# Patient Record
Sex: Female | Born: 1995 | Race: Black or African American | Hispanic: No | Marital: Married | Smoking: Former smoker
Health system: Southern US, Community
[De-identification: ages and names within clinical notes are randomized; demographics above are authoritative.]

## PROBLEM LIST (undated history)

## (undated) ENCOUNTER — Inpatient Hospital Stay (HOSPITAL_COMMUNITY): Payer: Self-pay

## (undated) ENCOUNTER — Inpatient Hospital Stay: Payer: Self-pay

## (undated) DIAGNOSIS — O321XX0 Maternal care for breech presentation, not applicable or unspecified: Secondary | ICD-10-CM

## (undated) DIAGNOSIS — IMO0002 Reserved for concepts with insufficient information to code with codable children: Secondary | ICD-10-CM

## (undated) DIAGNOSIS — O039 Complete or unspecified spontaneous abortion without complication: Secondary | ICD-10-CM

## (undated) DIAGNOSIS — G43909 Migraine, unspecified, not intractable, without status migrainosus: Secondary | ICD-10-CM

## (undated) DIAGNOSIS — D219 Benign neoplasm of connective and other soft tissue, unspecified: Secondary | ICD-10-CM

## (undated) DIAGNOSIS — D649 Anemia, unspecified: Secondary | ICD-10-CM

## (undated) HISTORY — DX: Benign neoplasm of connective and other soft tissue, unspecified: D21.9

---

## 2013-12-29 HISTORY — PX: NOSE SURGERY: SHX723

## 2014-08-13 ENCOUNTER — Encounter (HOSPITAL_COMMUNITY): Payer: Self-pay | Admitting: Emergency Medicine

## 2014-08-13 DIAGNOSIS — Z3202 Encounter for pregnancy test, result negative: Secondary | ICD-10-CM | POA: Insufficient documentation

## 2014-08-13 DIAGNOSIS — Z862 Personal history of diseases of the blood and blood-forming organs and certain disorders involving the immune mechanism: Secondary | ICD-10-CM | POA: Insufficient documentation

## 2014-08-13 DIAGNOSIS — S199XXA Unspecified injury of neck, initial encounter: Secondary | ICD-10-CM

## 2014-08-13 DIAGNOSIS — F17200 Nicotine dependence, unspecified, uncomplicated: Secondary | ICD-10-CM | POA: Insufficient documentation

## 2014-08-13 DIAGNOSIS — S0180XA Unspecified open wound of other part of head, initial encounter: Secondary | ICD-10-CM | POA: Insufficient documentation

## 2014-08-13 DIAGNOSIS — S1093XA Contusion of unspecified part of neck, initial encounter: Secondary | ICD-10-CM

## 2014-08-13 DIAGNOSIS — S0003XA Contusion of scalp, initial encounter: Secondary | ICD-10-CM | POA: Insufficient documentation

## 2014-08-13 DIAGNOSIS — S0083XA Contusion of other part of head, initial encounter: Secondary | ICD-10-CM | POA: Insufficient documentation

## 2014-08-13 DIAGNOSIS — S0993XA Unspecified injury of face, initial encounter: Secondary | ICD-10-CM | POA: Insufficient documentation

## 2014-08-13 LAB — CBC WITH DIFFERENTIAL/PLATELET
BASOS ABS: 0 10*3/uL (ref 0.0–0.1)
Basophils Relative: 0 % (ref 0–1)
Eosinophils Absolute: 0 10*3/uL (ref 0.0–0.7)
Eosinophils Relative: 0 % (ref 0–5)
HCT: 29.7 % — ABNORMAL LOW (ref 36.0–46.0)
Hemoglobin: 9.4 g/dL — ABNORMAL LOW (ref 12.0–15.0)
LYMPHS ABS: 1.9 10*3/uL (ref 0.7–4.0)
Lymphocytes Relative: 21 % (ref 12–46)
MCH: 20.8 pg — AB (ref 26.0–34.0)
MCHC: 31.6 g/dL (ref 30.0–36.0)
MCV: 65.9 fL — AB (ref 78.0–100.0)
MONO ABS: 0.7 10*3/uL (ref 0.1–1.0)
Monocytes Relative: 8 % (ref 3–12)
Neutro Abs: 6.4 10*3/uL (ref 1.7–7.7)
Neutrophils Relative %: 71 % (ref 43–77)
Platelets: 332 10*3/uL (ref 150–400)
RBC: 4.51 MIL/uL (ref 3.87–5.11)
RDW: 19.4 % — AB (ref 11.5–15.5)
WBC: 9 10*3/uL (ref 4.0–10.5)

## 2014-08-13 LAB — COMPREHENSIVE METABOLIC PANEL
ALBUMIN: 3.9 g/dL (ref 3.5–5.2)
ALT: 10 U/L (ref 0–35)
ANION GAP: 13 (ref 5–15)
AST: 17 U/L (ref 0–37)
Alkaline Phosphatase: 43 U/L (ref 39–117)
BUN: 8 mg/dL (ref 6–23)
CHLORIDE: 102 meq/L (ref 96–112)
CO2: 23 mEq/L (ref 19–32)
Calcium: 9.6 mg/dL (ref 8.4–10.5)
Creatinine, Ser: 0.6 mg/dL (ref 0.50–1.10)
GFR calc Af Amer: >90 mL/min (ref >90)
GFR calc non Af Amer: >90 mL/min (ref >90)
Glucose, Bld: 90 mg/dL (ref 70–99)
Potassium: 3.3 mEq/L — ABNORMAL LOW (ref 3.7–5.3)
Sodium: 138 mEq/L (ref 137–147)
TOTAL PROTEIN: 7.7 g/dL (ref 6.0–8.3)
Total Bilirubin: 0.3 mg/dL (ref 0.3–1.2)
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
## 2014-08-14 ENCOUNTER — Emergency Department (HOSPITAL_COMMUNITY)
Admission: EM | Admit: 2014-08-14 | Discharge: 2014-08-14 | Disposition: A | Discharge status: Home / Self Care | Payer: Self-pay | Attending: Emergency Medicine | Admitting: Emergency Medicine

## 2014-08-14 DIAGNOSIS — S0181XA Laceration without foreign body of other part of head, initial encounter: Secondary | ICD-10-CM

## 2014-08-14 DIAGNOSIS — S0033XA Contusion of nose, initial encounter: Secondary | ICD-10-CM

## 2014-08-14 HISTORY — DX: Anemia, unspecified: D64.9

## 2014-08-14 LAB — URINALYSIS, ROUTINE W REFLEX MICROSCOPIC
BILIRUBIN URINE: NEGATIVE
Glucose, UA: NEGATIVE mg/dL
Hgb urine dipstick: NEGATIVE
Ketones, ur: 15 mg/dL — AB
NITRITE: NEGATIVE
PROTEIN: NEGATIVE mg/dL
SPECIFIC GRAVITY, URINE: 1.025 (ref 1.005–1.030)
UROBILINOGEN UA: 1 mg/dL (ref 0.0–1.0)
pH: 6.5 (ref 5.0–8.0)

## 2014-08-14 LAB — URINE MICROSCOPIC-ADD ON

## 2014-08-14 LAB — POC URINE PREG, ED: PREG TEST UR: NEGATIVE

## 2014-08-14 MED ORDER — TRAMADOL HCL 50 MG PO TABS
50.0000 mg | ORAL_TABLET | Freq: Once | ORAL | Status: AC
Start: 2014-08-14 — End: 2014-08-14
  Administered 2014-08-14: 50 mg via ORAL
  Filled 2014-08-14: qty 1

## 2014-08-14 MED ORDER — KETOROLAC TROMETHAMINE 30 MG/ML IJ SOLN
30.0000 mg | Freq: Once | INTRAMUSCULAR | Status: DC
  Filled 2014-08-14: qty 1

## 2014-08-14 MED ORDER — KETOROLAC TROMETHAMINE 30 MG/ML IJ SOLN
30.0000 mg | Freq: Once | INTRAMUSCULAR | Status: AC
  Administered 2014-08-14: 30 mg via INTRAMUSCULAR

## 2014-08-14 MED ORDER — OXYCODONE-ACETAMINOPHEN 5-325 MG PO TABS
2.0000 | ORAL_TABLET | Freq: Once | ORAL | Status: AC
  Administered 2014-08-14: 2 via ORAL
  Filled 2014-08-14: qty 2

## 2014-08-14 MED ORDER — BACITRACIN ZINC 500 UNIT/GM EX OINT: 1.0000 "application " | TOPICAL_OINTMENT | Freq: Two times a day (BID) | CUTANEOUS | Status: DC

## 2014-08-14 NOTE — ED Notes (Signed)
NAD at this time.  
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
## 2014-08-14 NOTE — Discharge Instructions (Signed)
Contusion °A contusion is a deep bruise. Contusions are the result of an injury that caused bleeding under the skin. The contusion may turn blue, purple, or yellow. Minor injuries will give you a painless contusion, but more severe contusions may stay painful and swollen for a few weeks.  °CAUSES  °A contusion is usually caused by a blow, trauma, or direct force to an area of the body. °SYMPTOMS  °· Swelling and redness of the injured area. °· Bruising of the injured area. °· Tenderness and soreness of the injured area. °· Pain. °DIAGNOSIS  °The diagnosis can be made by taking a history and physical exam. An X-ray, CT scan, or MRI may be needed to determine if there were any associated injuries, such as fractures. °TREATMENT  °Specific treatment will depend on what area of the body was injured. In general, the best treatment for a contusion is resting, icing, elevating, and applying cold compresses to the injured area. Over-the-counter medicines may also be recommended for pain control. Ask your caregiver what the best treatment is for your contusion. °HOME CARE INSTRUCTIONS  °· Put ice on the injured area. °¨ Put ice in a plastic bag. °¨ Place a towel between your skin and the bag. °¨ Leave the ice on for 15-20 minutes, 3-4 times a day, or as directed by your health care provider. °· Only take over-the-counter or prescription medicines for pain, discomfort, or fever as directed by your caregiver. Your caregiver may recommend avoiding anti-inflammatory medicines (aspirin, ibuprofen, and naproxen) for 48 hours because these medicines may increase bruising. °· Rest the injured area. °· If possible, elevate the injured area to reduce swelling. °SEEK IMMEDIATE MEDICAL CARE IF:  °· You have increased bruising or swelling. °· You have pain that is getting worse. °· Your swelling or pain is not relieved with medicines. °MAKE SURE YOU:  °· Understand these instructions. °· Will watch your condition. °· Will get help right  away if you are not doing well or get worse. °Document Released: 09/24/2005 Document Revised: 12/20/2013 Document Reviewed: 10/20/2011 °[employer]® Patient Information ©2015 [employer]. This information is not intended to replace advice given to you by your health care provider. Make sure you discuss any questions you have with your health care provider. ° °

## 2014-09-13 ENCOUNTER — Emergency Department (HOSPITAL_COMMUNITY)
Admission: EM | Admit: 2014-09-13 | Discharge: 2014-09-13 | Disposition: A | Discharge status: Home / Self Care | Payer: Medicaid Other | Attending: Emergency Medicine | Admitting: Emergency Medicine

## 2014-09-13 ENCOUNTER — Encounter (HOSPITAL_COMMUNITY): Payer: Self-pay | Admitting: Emergency Medicine

## 2014-09-13 ENCOUNTER — Emergency Department (HOSPITAL_COMMUNITY): Payer: Medicaid Other

## 2014-09-13 DIAGNOSIS — O209 Hemorrhage in early pregnancy, unspecified: Secondary | ICD-10-CM | POA: Diagnosis present

## 2014-09-13 DIAGNOSIS — B9689 Other specified bacterial agents as the cause of diseases classified elsewhere: Secondary | ICD-10-CM | POA: Diagnosis not present

## 2014-09-13 DIAGNOSIS — Z862 Personal history of diseases of the blood and blood-forming organs and certain disorders involving the immune mechanism: Secondary | ICD-10-CM | POA: Diagnosis not present

## 2014-09-13 DIAGNOSIS — O2390 Unspecified genitourinary tract infection in pregnancy, unspecified trimester: Secondary | ICD-10-CM | POA: Insufficient documentation

## 2014-09-13 DIAGNOSIS — O200 Threatened abortion: Secondary | ICD-10-CM | POA: Insufficient documentation

## 2014-09-13 DIAGNOSIS — A499 Bacterial infection, unspecified: Secondary | ICD-10-CM | POA: Insufficient documentation

## 2014-09-13 DIAGNOSIS — O99330 Smoking (tobacco) complicating pregnancy, unspecified trimester: Secondary | ICD-10-CM | POA: Diagnosis not present

## 2014-09-13 DIAGNOSIS — N760 Acute vaginitis: Secondary | ICD-10-CM | POA: Diagnosis not present

## 2014-09-13 DIAGNOSIS — Z792 Long term (current) use of antibiotics: Secondary | ICD-10-CM | POA: Insufficient documentation

## 2014-09-13 DIAGNOSIS — N390 Urinary tract infection, site not specified: Secondary | ICD-10-CM

## 2014-09-13 LAB — URINE MICROSCOPIC-ADD ON

## 2014-09-13 LAB — CBC WITH DIFFERENTIAL/PLATELET
BASOS PCT: 0 % (ref 0–1)
Basophils Absolute: 0 10*3/uL (ref 0.0–0.1)
EOS PCT: 1 % (ref 0–5)
Eosinophils Absolute: 0.1 10*3/uL (ref 0.0–0.7)
HCT: 32.1 % — ABNORMAL LOW (ref 36.0–46.0)
Hemoglobin: 10.4 g/dL — ABNORMAL LOW (ref 12.0–15.0)
LYMPHS PCT: 39 % (ref 12–46)
Lymphs Abs: 2 10*3/uL (ref 0.7–4.0)
MCH: 21.7 pg — ABNORMAL LOW (ref 26.0–34.0)
MCHC: 32.4 g/dL (ref 30.0–36.0)
MCV: 67 fL — ABNORMAL LOW (ref 78.0–100.0)
Monocytes Absolute: 0.8 10*3/uL (ref 0.1–1.0)
Monocytes Relative: 15 % — ABNORMAL HIGH (ref 3–12)
NEUTROS PCT: 45 % (ref 43–77)
Neutro Abs: 2.3 10*3/uL (ref 1.7–7.7)
PLATELETS: 286 10*3/uL (ref 150–400)
RBC: 4.79 MIL/uL (ref 3.87–5.11)
RDW: 20.5 % — ABNORMAL HIGH (ref 11.5–15.5)
WBC: 5.2 10*3/uL (ref 4.0–10.5)

## 2014-09-13 LAB — URINALYSIS, ROUTINE W REFLEX MICROSCOPIC
Bilirubin Urine: NEGATIVE
Glucose, UA: NEGATIVE mg/dL
Hgb urine dipstick: NEGATIVE
Ketones, ur: 15 mg/dL — AB
Nitrite: POSITIVE — AB
Protein, ur: NEGATIVE mg/dL
Specific Gravity, Urine: 1.029 (ref 1.005–1.030)
Urobilinogen, UA: 1 mg/dL (ref 0.0–1.0)
pH: 7.5 (ref 5.0–8.0)

## 2014-09-13 LAB — WET PREP, GENITAL
TRICH WET PREP: NONE SEEN
YEAST WET PREP: NONE SEEN

## 2014-09-13 LAB — PREGNANCY, URINE: Preg Test, Ur: POSITIVE — AB

## 2014-09-13 LAB — HCG, QUANTITATIVE, PREGNANCY: hCG, Beta Chain, Quant, S: 2784 m[IU]/mL — ABNORMAL HIGH (ref <5)

## 2014-09-13 LAB — ABO/RH: ABO/RH(D): A POS

## 2014-09-13 LAB — OB RESULTS CONSOLE GC/CHLAMYDIA
Chlamydia: NEGATIVE
Gonorrhea: NEGATIVE

## 2014-09-13 MED ORDER — PRENATAL VITAMINS 28-0.8 MG PO TABS: 1.0000 | ORAL_TABLET | Freq: Every day | ORAL | Status: DC

## 2014-09-13 MED ORDER — METRONIDAZOLE 500 MG PO TABS: 500.0000 mg | ORAL_TABLET | Freq: Two times a day (BID) | ORAL | Status: DC

## 2014-09-13 MED ORDER — CEPHALEXIN 500 MG PO CAPS: 500.0000 mg | ORAL_CAPSULE | Freq: Two times a day (BID) | ORAL | Status: DC

## 2014-09-13 NOTE — ED Provider Notes (Signed)
CSN: [id number]     Arrival date & time 09/13/14  1210 History   First MD Initiated Contact with Patient 09/13/14 1254     Chief Complaint  Patient presents with  . Vaginal Bleeding     (Consider location/radiation/quality/duration/timing/severity/associated sxs/prior Treatment) Patient is a 18 y.o. female presenting with vaginal bleeding. The history is provided by the patient. No language interpreter was used.  Vaginal Bleeding Severity:  Mild Duration:  1 week Timing:  Intermittent Progression:  Worsening Menstrual history:  Regular Possible pregnancy: yes   Associated symptoms: abdominal pain   Associated symptoms: no back pain, no dysuria, no fever and no vaginal discharge   Associated symptoms comment:  She is here for evaluation of vaginal bleeding after a home pregnancy test was positive this week. Last menstrual cycle was July of this year. She has seen intermittent, light vaginal spotting about every other day for the past week. She reports left adnexal and suprapubic cramping. No vomiting. She denies back pain or dysuria. She is G2P1 with a previous uncomplicated, full term pregnancy.   Past Medical History  Diagnosis Date  . Anemia    Past Surgical History  Procedure Laterality Date  . Cesarean section     History reviewed. No pertinent family history. History  Substance Use Topics  . Smoking status: Current Some Day Smoker    Types: Cigarettes  . Smokeless tobacco: Not on file  . Alcohol Use: No   OB History   Grav Para Term Preterm Abortions TAB SAB Ect Mult Living                 Review of Systems  Constitutional: Negative for fever.  Respiratory: Negative.   Cardiovascular: Negative.   Gastrointestinal: Positive for abdominal pain. Negative for vomiting.  Genitourinary: Positive for vaginal bleeding. Negative for dysuria and vaginal discharge.  Musculoskeletal: Negative.  Negative for back pain.  Neurological: Negative.       Allergies  Asa;  Onion; Venomil honey bee venom; and Mustard  Home Medications   Prior to Admission medications   Medication Sig Start Date End Date Taking? Authorizing Provider  bacitracin ointment Apply 1 application topically 2 (two) times daily. 08/14/14  Yes Elyn Peers, MD   BP 90/48  Pulse 62  Temp(Src) 98.9 F (37.2 C) (Oral)  Resp 15  SpO2 100% Physical Exam  Constitutional: She is oriented to person, place, and time. She appears well-developed and well-nourished. No distress.  Neck: Normal range of motion.  Cardiovascular: Normal rate.   Pulmonary/Chest: Effort normal.  Abdominal: Soft. There is no tenderness.  Genitourinary:  No cervical bleeding present. Small amount grayish discharge. No CMT, no adnexal tenderness. There is mild to moderate left adnexal tenderness without palpable mass.   Musculoskeletal: Normal range of motion.  Neurological: She is alert and oriented to person, place, and time.  Skin: Skin is warm and dry.  Psychiatric: She has a normal mood and affect.    ED Course  Procedures (including critical care time) Labs Review Labs Reviewed  PREGNANCY, URINE - Abnormal; Notable for the following:    Preg Test, Ur POSITIVE (*)    All other components within normal limits  URINALYSIS, ROUTINE W REFLEX MICROSCOPIC - Abnormal; Notable for the following:    APPearance HAZY (*)    Ketones, ur 15 (*)    Nitrite POSITIVE (*)    Leukocytes, UA TRACE (*)    All other components within normal limits  CBC WITH DIFFERENTIAL - Abnormal; Notable  for the following:    Hemoglobin 10.4 (*)    HCT 32.1 (*)    MCV 67.0 (*)    MCH 21.7 (*)    RDW 20.5 (*)    All other components within normal limits  HCG, QUANTITATIVE, PREGNANCY - Abnormal; Notable for the following:    hCG, Beta Chain, Quant, S 2784 (*)    All other components within normal limits  URINE MICROSCOPIC-ADD ON - Abnormal; Notable for the following:    Squamous Epithelial / LPF FEW (*)    Bacteria, UA MANY (*)     All other components within normal limits  WET PREP, GENITAL  GC/CHLAMYDIA PROBE AMP  ABO/RH   Results for orders placed during the hospital encounter of 09/13/14  WET PREP, GENITAL      Result Value Ref Range   Yeast Wet Prep HPF POC NONE SEEN  NONE SEEN   Trich, Wet Prep NONE SEEN  NONE SEEN   Clue Cells Wet Prep HPF POC MODERATE (*) NONE SEEN   WBC, Wet Prep HPF POC FEW (*) NONE SEEN  PREGNANCY, URINE      Result Value Ref Range   Preg Test, Ur POSITIVE (*) NEGATIVE  URINALYSIS, ROUTINE W REFLEX MICROSCOPIC      Result Value Ref Range   Color, Urine YELLOW  YELLOW   APPearance HAZY (*) CLEAR   Specific Gravity, Urine 1.029  1.005 - 1.030   pH 7.5  5.0 - 8.0   Glucose, UA NEGATIVE  NEGATIVE mg/dL   Hgb urine dipstick NEGATIVE  NEGATIVE   Bilirubin Urine NEGATIVE  NEGATIVE   Ketones, ur 15 (*) NEGATIVE mg/dL   Protein, ur NEGATIVE  NEGATIVE mg/dL   Urobilinogen, UA 1.0  0.0 - 1.0 mg/dL   Nitrite POSITIVE (*) NEGATIVE   Leukocytes, UA TRACE (*) NEGATIVE  CBC WITH DIFFERENTIAL      Result Value Ref Range   WBC 5.2  4.0 - 10.5 K/uL   RBC 4.79  3.87 - 5.11 MIL/uL   Hemoglobin 10.4 (*) 12.0 - 15.0 g/dL   HCT 32.1 (*) 36.0 - 46.0 %   MCV 67.0 (*) 78.0 - 100.0 fL   MCH 21.7 (*) 26.0 - 34.0 pg   MCHC 32.4  30.0 - 36.0 g/dL   RDW 20.5 (*) 11.5 - 15.5 %   Platelets 286  150 - 400 K/uL   Neutrophils Relative % 45  43 - 77 %   Lymphocytes Relative 39  12 - 46 %   Monocytes Relative 15 (*) 3 - 12 %   Eosinophils Relative 1  0 - 5 %   Basophils Relative 0  0 - 1 %   Neutro Abs 2.3  1.7 - 7.7 K/uL   Lymphs Abs 2.0  0.7 - 4.0 K/uL   Monocytes Absolute 0.8  0.1 - 1.0 K/uL   Eosinophils Absolute 0.1  0.0 - 0.7 K/uL   Basophils Absolute 0.0  0.0 - 0.1 K/uL   RBC Morphology ELLIPTOCYTES    HCG, QUANTITATIVE, PREGNANCY      Result Value Ref Range   hCG, Beta Chain, Quant, S 2784 (*) <5 mIU/mL  URINE MICROSCOPIC-ADD ON      Result Value Ref Range   Squamous Epithelial / LPF FEW (*)  RARE   WBC, UA 3-6  <3 WBC/hpf   RBC / HPF 0-2  <3 RBC/hpf   Bacteria, UA MANY (*) RARE   Urine-Other MUCOUS PRESENT       Imaging  Review No results found.   EKG Interpretation None      MDM   Final diagnoses:  None    1. Threatened abortion 2. UTI 3. BV  Ultrasound pending. Patient care transferred to [facility], [facility] pending results.    Dewaine Oats, PA-C 09/13/14 1616

## 2014-09-13 NOTE — ED Provider Notes (Signed)
Medical screening examination/treatment/procedure(s) were performed by non-physician practitioner and as supervising physician I was immediately available for consultation/collaboration.   EKG Interpretation None       Virgel Manifold, MD 09/13/14 1711

## 2014-09-13 NOTE — ED Notes (Signed)
Pt has not had an ultrasound to confirm intrauterine pregnancy and patient is c/o pain to left lower abdomen

## 2014-09-13 NOTE — Discharge Instructions (Signed)
Read the information below.  Use the prescribed medication as directed.  Please discuss all new medications with your pharmacist.  You may return to the Emergency Department at any time for worsening condition or any new symptoms that concern you.  If you develop fevers, worsening abdominal pain, uncontrolled bleeding, uncontrolled vomiting, or are unable to tolerate fluids by mouth, go directly to [facility] for a recheck.    Please follow instructions as discussed for vaginal rest.     Bacterial Vaginosis Bacterial vaginosis is a vaginal infection that occurs when the normal balance of bacteria in the vagina is disrupted. It results from an overgrowth of certain bacteria. This is the most common vaginal infection in women of childbearing age. Treatment is important to prevent complications, especially in pregnant women, as it can cause a premature delivery. CAUSES  Bacterial vaginosis is caused by an increase in harmful bacteria that are normally present in smaller amounts in the vagina. Several different kinds of bacteria can cause bacterial vaginosis. However, the reason that the condition develops is not fully understood. RISK FACTORS Certain activities or behaviors can put you at an increased risk of developing bacterial vaginosis, including:  Having a new sex partner or multiple sex partners.  Douching.  Using an intrauterine device (IUD) for contraception. Women do not get bacterial vaginosis from toilet seats, bedding, swimming pools, or contact with objects around them. SIGNS AND SYMPTOMS  Some women with bacterial vaginosis have no signs or symptoms. Common symptoms include:  Grey vaginal discharge.  A fishlike odor with discharge, especially after sexual intercourse.  Itching or burning of the vagina and vulva.  Burning or pain with urination. DIAGNOSIS  Your health care provider will take a medical history and examine the vagina for signs of bacterial vaginosis. A  sample of vaginal fluid may be taken. Your health care provider will look at this sample under a microscope to check for bacteria and abnormal cells. A vaginal pH test may also be done.  TREATMENT  Bacterial vaginosis may be treated with antibiotic medicines. These may be given in the form of a pill or a vaginal cream. A second round of antibiotics may be prescribed if the condition comes back after treatment.  HOME CARE INSTRUCTIONS   Only take over-the-counter or prescription medicines as directed by your health care provider.  If antibiotic medicine was prescribed, take it as directed. Make sure you finish it even if you start to feel better.  Do not have sex until treatment is completed.  Tell all sexual partners that you have a vaginal infection. They should see their health care provider and be treated if they have problems, such as a mild rash or itching.  Practice safe sex by using condoms and only having one sex partner. SEEK MEDICAL CARE IF:   Your symptoms are not improving after 3 days of treatment.  You have increased discharge or pain.  You have a fever. MAKE SURE YOU:   Understand these instructions.  Will watch your condition.  Will get help right away if you are not doing well or get worse. FOR MORE INFORMATION  Centers for Disease Control and Prevention, Division of STD Prevention: [employer]raiser[employer]raud[employer] American Sexual Health Association (ASHA): www.ashastd.org  Document Released: 12/15/2005 Document Revised: 10/05/2013 Document Reviewed: 07/27/2013 [facility] Patient Information 2015 [place], [place]. This information is not intended to replace advice given to you by your health care provider. Make sure you discuss any questions you have with your health care provider.  Threatened Miscarriage A threatened miscarriage is when you have vaginal bleeding during your first 20 weeks of pregnancy but the pregnancy has not ended. Your doctor will do tests to make sure you are  still pregnant. The cause of the bleeding may not be known. This condition does not mean your pregnancy will end. It does increase the risk of it ending (complete miscarriage). HOME CARE   Make sure you keep all your doctor visits for prenatal care.  Get plenty of rest.  Do not have sex or use tampons if you have vaginal bleeding.  Do not douche.  Do not smoke or use drugs.  Do not drink alcohol.  Avoid caffeine. GET HELP IF:  You have light bleeding from your vagina.  You have belly pain or cramping.  You have a fever. GET HELP RIGHT AWAY IF:   You have heavy bleeding from your vagina.  You have clots of blood coming from your vagina.  You have bad pain or cramps in your low back or belly.  You have fever, chills, and bad belly pain. MAKE SURE YOU:   Understand these instructions.  Will watch your condition.  Will get help right away if you are not doing well or get worse. Document Released: 11/27/2008 Document Revised: 12/20/2013 Document Reviewed: 10/11/2013 [facility] Patient Information 2015 [place], [place]. This information is not intended to replace advice given to you by your health care provider. Make sure you discuss any questions you have with your health care provider.  Upper Respiratory Infection, Adult An upper respiratory infection (URI) is also known as the common cold. It is often caused by a type of germ (virus). Colds are easily spread (contagious). You can pass it to others by kissing, coughing, sneezing, or drinking out of the same glass. Usually, you get better in 1 or 2 weeks.  HOME CARE   Only take medicine as told by your doctor.  Use a warm mist humidifier or breathe in steam from a hot shower.  Drink enough water and fluids to keep your pee (urine) clear or pale yellow.  Get plenty of rest.  Return to work when your temperature is back to normal or as told by your doctor. You may use a face mask and wash your hands to stop your cold from  spreading. GET HELP RIGHT AWAY IF:   After the first few days, you feel you are getting worse.  You have questions about your medicine.  You have chills, shortness of breath, or brown or red spit (mucus).  You have yellow or brown snot (nasal discharge) or pain in the face, especially when you bend forward.  You have a fever, puffy (swollen) neck, pain when you swallow, or white spots in the back of your throat.  You have a bad headache, ear pain, sinus pain, or chest pain.  You have a high-pitched whistling sound when you breathe in and out (wheezing).  You have a lasting cough or cough up blood.  You have sore muscles or a stiff neck. MAKE SURE YOU:   Understand these instructions.  Will watch your condition.  Will get help right away if you are not doing well or get worse. Document Released: 06/02/2008 Document Revised: 03/08/2012 Document Reviewed: 03/22/2014 [facility] Patient Information 2015 [employer] [place], [place]. This information is not intended to replace advice given to you by your health care provider. Make sure you discuss any questions you have with your health care provider.

## 2014-09-13 NOTE — Discharge Planning (Signed)
[place] to patient regarding primary care resources and establishing care with a provider. Resource guide and my contact information provided for any future questions or concerns. No other Community Liaison needs identified at this time.

## 2014-09-13 NOTE — ED Provider Notes (Signed)
4:11 PM Patient signed out to me at change of shift by Charlann Lange, PA-C.  Patient is pregnant with vaginal bleeding.  Also with UTI and BV.  Pending [place] to /ro ectopic.   6:35 PM Patient feeling well, no concerns at this time.  Pending [place] results.   7:15 PM Blood type is A positive.  Intrauterine gestational and yolk sac visualized.  Pt advised of all results, advised to follow vaginal rest until she is cleared by her ObGyn.    Discussed result, findings, treatment, and follow up  with patient.  Pt given return precautions.  Pt verbalizes understanding and agrees with plan.      Results for orders placed during the hospital encounter of 09/13/14  WET PREP, GENITAL      Result Value Ref Range   Yeast Wet Prep HPF POC NONE SEEN  NONE SEEN   Trich, Wet Prep NONE SEEN  NONE SEEN   Clue Cells Wet Prep HPF POC MODERATE (*) NONE SEEN   WBC, Wet Prep HPF POC FEW (*) NONE SEEN  PREGNANCY, URINE      Result Value Ref Range   Preg Test, Ur POSITIVE (*) NEGATIVE  URINALYSIS, ROUTINE W REFLEX MICROSCOPIC      Result Value Ref Range   Color, Urine YELLOW  YELLOW   APPearance HAZY (*) CLEAR   Specific Gravity, Urine 1.029  1.005 - 1.030   pH 7.5  5.0 - 8.0   Glucose, UA NEGATIVE  NEGATIVE mg/dL   Hgb urine dipstick NEGATIVE  NEGATIVE   Bilirubin Urine NEGATIVE  NEGATIVE   Ketones, ur 15 (*) NEGATIVE mg/dL   Protein, ur NEGATIVE  NEGATIVE mg/dL   Urobilinogen, UA 1.0  0.0 - 1.0 mg/dL   Nitrite POSITIVE (*) NEGATIVE   Leukocytes, UA TRACE (*) NEGATIVE  CBC WITH DIFFERENTIAL      Result Value Ref Range   WBC 5.2  4.0 - 10.5 K/uL   RBC 4.79  3.87 - 5.11 MIL/uL   Hemoglobin 10.4 (*) 12.0 - 15.0 g/dL   HCT 32.1 (*) 36.0 - 46.0 %   MCV 67.0 (*) 78.0 - 100.0 fL   MCH 21.7 (*) 26.0 - 34.0 pg   MCHC 32.4  30.0 - 36.0 g/dL   RDW 20.5 (*) 11.5 - 15.5 %   Platelets 286  150 - 400 K/uL   Neutrophils Relative % 45  43 - 77 %   Lymphocytes Relative 39  12 - 46 %   Monocytes Relative 15 (*) 3 - 12 %    Eosinophils Relative 1  0 - 5 %   Basophils Relative 0  0 - 1 %   Neutro Abs 2.3  1.7 - 7.7 K/uL   Lymphs Abs 2.0  0.7 - 4.0 K/uL   Monocytes Absolute 0.8  0.1 - 1.0 K/uL   Eosinophils Absolute 0.1  0.0 - 0.7 K/uL   Basophils Absolute 0.0  0.0 - 0.1 K/uL   RBC Morphology ELLIPTOCYTES    HCG, QUANTITATIVE, PREGNANCY      Result Value Ref Range   hCG, Beta Chain, Quant, S 2784 (*) <5 mIU/mL  URINE MICROSCOPIC-ADD ON      Result Value Ref Range   Squamous Epithelial / LPF FEW (*) RARE   WBC, UA 3-6  <3 WBC/hpf   RBC / HPF 0-2  <3 RBC/hpf   Bacteria, UA MANY (*) RARE   Urine-Other MUCOUS PRESENT    ABO/RH      Result Value  Ref Range   ABO/RH(D) A POS     No rh immune globuloin NOT A RH IMMUNE GLOBULIN CANDIDATE, PT RH POSITIVE     US Ob Comp Less 14 Wks  09/13/2014   CLINICAL DATA:  Vaginal bleeding, positive pregnancy test.  EXAM: OBSTETRIC <14 WK [place] AND TRANSVAGINAL OB US  TECHNIQUE: Both transabdominal and transvaginal ultrasound examinations were performed for complete evaluation of the gestation as well as the maternal uterus, adnexal regions, and pelvic cul-de-sac. Transvaginal technique was performed to assess early pregnancy.  COMPARISON:  None.  FINDINGS: Intrauterine gestational sac: Visualized/normal in shape.  Yolk sac:  Visualized  Embryo:  Not visualized  Cardiac Activity: Not visualized  MSD: 8 mm   5 w   4  d     [place] EDC: 05/12/15  Maternal uterus/adnexae: The ovaries appear normal, seen transabdominally only. Small free fluid noted in the cul de sac.  IMPRESSION: Intrauterine gestational sac and yolk sac but no fetal pole or cardiac activity yet visualized. Followup ultrasound in 10-14 days is recommended to assess for appropriate pregnancy progression and for purposes of accurate dating.   Electronically Signed   By: Conchita Paris M.D.   On: 09/13/2014 18:41   US Ob Comp Less 14 Wks  09/13/2014   CLINICAL DATA:  Vaginal bleeding, positive pregnancy test.  EXAM: OBSTETRIC  <14 WK [place] AND TRANSVAGINAL OB US  TECHNIQUE: Both transabdominal and transvaginal ultrasound examinations were performed for complete evaluation of the gestation as well as the maternal uterus, adnexal regions, and pelvic cul-de-sac. Transvaginal technique was performed to assess early pregnancy.  COMPARISON:  None.  FINDINGS: Intrauterine gestational sac: Visualized/normal in shape.  Yolk sac:  Visualized  Embryo:  Not visualized  Cardiac Activity: Not visualized  MSD: 8 mm   5 w   4  d     [place] EDC: 05/12/15  Maternal uterus/adnexae: The ovaries appear normal, seen transabdominally only. Small free fluid noted in the cul de sac.  IMPRESSION: Intrauterine gestational sac and yolk sac but no fetal pole or cardiac activity yet visualized. Followup ultrasound in 10-14 days is recommended to assess for appropriate pregnancy progression and for purposes of accurate dating.   Electronically Signed   By: Conchita Paris M.D.   On: 09/13/2014 18:41      Clayton Bibles, PA-C 09/13/14 1916

## 2014-09-13 NOTE — ED Notes (Signed)
Pt in stating she noted some vaginal bleeding described as spotting since last night, pt is approx [redacted] weeks pregnant, states she has been having intermittent vaginal spotting for the last few weeks but this is the first time it has happened two days in a row, pt reports abd cramping that is not new, no distress noted

## 2014-09-14 LAB — GC/CHLAMYDIA PROBE AMP
CT Probe RNA: NEGATIVE
GC Probe RNA: NEGATIVE

## 2014-09-14 NOTE — ED Provider Notes (Signed)
Medical screening examination/treatment/procedure(s) were performed by non-physician practitioner and as supervising physician I was immediately available for consultation/collaboration.   EKG Interpretation None       Virgel Manifold, MD 09/14/14 2137

## 2014-09-15 LAB — URINE CULTURE: Special Requests: NORMAL

## 2014-09-16 ENCOUNTER — Telehealth (HOSPITAL_BASED_OUTPATIENT_CLINIC_OR_DEPARTMENT_OTHER): Payer: Self-pay | Admitting: Emergency Medicine

## 2014-09-16 NOTE — Telephone Encounter (Signed)
Post ED Visit - Positive Culture Follow-up  Culture report reviewed by antimicrobial stewardship pharmacist: []  Wes Whitfield, Pharm.D., BCPS []  Heide Guile, Pharm.D., BCPS []  Alycia Rossetti, Pharm.D., BCPS [x]  [place], [place].D., BCPS, AAHIVP []  Legrand Como, Pharm.D., BCPS, AAHIVP []  Carly Sabat, Pharm.D. []  Elenor Quinones, Pharm.D.  Positive urine  Culture >100,000 colonies/ml E. Coli Treated with Cephalexin 500 mg po caps bid x 5 days , organism sensitive to the same and no further patient follow-up is required at this time.  Hazle Nordmann 09/16/2014, 5:03 PM

## 2014-09-23 ENCOUNTER — Telehealth: Payer: Self-pay | Admitting: Family

## 2014-09-23 DIAGNOSIS — N390 Urinary tract infection, site not specified: Secondary | ICD-10-CM

## 2014-09-23 NOTE — Progress Notes (Signed)
We are sorry that you are not feeling well.  Here is how we plan to help!  Based on what you shared with me it looks like you will need a face to face visit for complicated symptoms that could represent a serious infection.  If you are having a true medical emergency please call 911.  If you need an urgent face to face visit, [facility] has four urgent care centers for your convenience.    [facility] [place] a Provider at this Location  [place], [place] [address]   8 am to 8 pm Monday-Friday   9 am to 7 pm [facility] [place] a Provider at this Location  [place], [place], [place] [address]   8 am to 8 pm Monday-Friday   9 am to 6 pm Saturday   11 am to 6 pm Sunday      [facility]gent [facility] at [place] Get Driving Directions  [address] [place], [place] [address]   8 am to 8 pm Monday-Friday   9 am to 4 pm Saturday-Sunday     [facility] (a walk in primary care provider)  [place] a Provider at this Location  [place], [place] [address]   8 am to 8:30 pm Monday-Thursday   8 am to 6 pm Friday   8 am to 4 pm Saturday-Sunday  Your e-visit answers were reviewed by a board certified advanced clinical practitioner to complete your personal care plan.  Depending on the condition, your plan could have included both over the counter or prescription medications.  You will get an e-mail in the next two days asking about your experience.  I hope that your e-visit has been valuable and will speed your recovery . Thank you for choosing an e-visit.

## 2014-09-25 ENCOUNTER — Encounter: Payer: Self-pay | Admitting: Internal Medicine

## 2014-09-25 DIAGNOSIS — Z3491 Encounter for supervision of normal pregnancy, unspecified, first trimester: Secondary | ICD-10-CM

## 2014-10-24 ENCOUNTER — Ambulatory Visit (INDEPENDENT_AMBULATORY_CARE_PROVIDER_SITE_OTHER): Payer: Self-pay | Admitting: Family

## 2014-10-24 ENCOUNTER — Ambulatory Visit (HOSPITAL_COMMUNITY)
Admission: RE | Admit: 2014-10-24 | Discharge: 2014-10-24 | Disposition: A | Discharge status: Home / Self Care | Payer: Self-pay | Source: Ambulatory Visit | Attending: Family | Admitting: Family

## 2014-10-24 ENCOUNTER — Other Ambulatory Visit: Payer: Self-pay | Admitting: Family

## 2014-10-24 ENCOUNTER — Encounter: Payer: Self-pay | Admitting: Family

## 2014-10-24 VITALS — BP 97/46 | HR 68 | Temp 98.4°F | Wt 213.4 lb

## 2014-10-24 DIAGNOSIS — Z3A00 Weeks of gestation of pregnancy not specified: Secondary | ICD-10-CM | POA: Insufficient documentation

## 2014-10-24 DIAGNOSIS — Z3493 Encounter for supervision of normal pregnancy, unspecified, third trimester: Secondary | ICD-10-CM | POA: Insufficient documentation

## 2014-10-24 DIAGNOSIS — O3680X1 Pregnancy with inconclusive fetal viability, fetus 1: Secondary | ICD-10-CM

## 2014-10-24 DIAGNOSIS — Z3491 Encounter for supervision of normal pregnancy, unspecified, first trimester: Secondary | ICD-10-CM

## 2014-10-24 DIAGNOSIS — O3680X0 Pregnancy with inconclusive fetal viability, not applicable or unspecified: Secondary | ICD-10-CM | POA: Insufficient documentation

## 2014-10-24 DIAGNOSIS — O039 Complete or unspecified spontaneous abortion without complication: Secondary | ICD-10-CM

## 2014-10-24 DIAGNOSIS — O34219 Maternal care for unspecified type scar from previous cesarean delivery: Secondary | ICD-10-CM

## 2014-10-24 LAB — POCT URINALYSIS DIP (DEVICE)
Bilirubin Urine: NEGATIVE
GLUCOSE, UA: NEGATIVE mg/dL
Hgb urine dipstick: NEGATIVE
KETONES UR: NEGATIVE mg/dL
Nitrite: NEGATIVE
PH: 5.5 (ref 5.0–8.0)
Protein, ur: NEGATIVE mg/dL
Specific Gravity, Urine: 1.025 (ref 1.005–1.030)
Urobilinogen, UA: 0.2 mg/dL (ref 0.0–1.0)

## 2014-10-24 NOTE — Progress Notes (Signed)
States had vaginal bleeding earlier in pregnancy for about a month- last episode about 3 weeks ago, had went to [facility] hospital to be evaluated. Discussed BMI, early glucola screen initiated. Discussed appropiate weight gain which patient has already exceeded.

## 2014-10-24 NOTE — Progress Notes (Signed)
Bedside US for viability - IUP not visualized.  Pt taken to [place] dept for stat viability scan.

## 2014-10-25 ENCOUNTER — Encounter: Payer: Self-pay | Admitting: Obstetrics and Gynecology

## 2014-10-25 LAB — HCG, QUANTITATIVE, PREGNANCY: hCG, Beta Chain, Quant, S: <2 m[IU]/mL

## 2014-10-26 NOTE — Progress Notes (Signed)
Pt present for initial prenatal visit.  Reports bleeding for approximately 4 weeks after initial ultrasound was completed.  Has not been seen since.  Uterus not palpable.  Sent to [facility] to confirm viability.  Ultrasound completed in radiology dept.  No IUP identified.  BHCG - < 2.  Explained to patient regarding SAB, ability to get pregnant in future, and to continue prenatal vitamins since pt still desires to attempt another pregnancy.

## 2014-10-31 ENCOUNTER — Encounter: Payer: Self-pay | Admitting: Family

## 2015-07-07 DIAGNOSIS — IMO0002 Reserved for concepts with insufficient information to code with codable children: Secondary | ICD-10-CM

## 2015-08-29 ENCOUNTER — Encounter (HOSPITAL_COMMUNITY): Payer: Self-pay | Admitting: *Deleted

## 2016-03-09 LAB — OB RESULTS CONSOLE ABO/RH: RH Type: POSITIVE

## 2016-03-09 LAB — OB RESULTS CONSOLE HGB/HCT, BLOOD
HEMATOCRIT: 30 %
Hemoglobin: 9.4 g/dL

## 2016-03-09 LAB — OB RESULTS CONSOLE ANTIBODY SCREEN: Antibody Screen: NEGATIVE

## 2016-03-09 LAB — OB RESULTS CONSOLE HIV ANTIBODY (ROUTINE TESTING): HIV: NONREACTIVE

## 2016-03-09 LAB — OB RESULTS CONSOLE PLATELET COUNT: PLATELETS: 315 10*3/uL

## 2016-03-09 LAB — OB RESULTS CONSOLE RUBELLA ANTIBODY, IGM: Rubella: IMMUNE

## 2016-03-09 LAB — OB RESULTS CONSOLE RPR: RPR: NONREACTIVE

## 2016-03-09 LAB — OB RESULTS CONSOLE HEPATITIS B SURFACE ANTIGEN: Hepatitis B Surface Ag: NEGATIVE

## 2016-04-14 LAB — CYTOLOGY - PAP
CHLAMYDIA DNA PROBE W/RFLX: NEGATIVE
N gonorrhoeae DNA Probe w/Rflx: NEGATIVE
PAP SMEAR: NORMAL

## 2016-05-20 ENCOUNTER — Inpatient Hospital Stay (HOSPITAL_COMMUNITY)
Admission: AD | Admit: 2016-05-20 | Discharge: 2016-05-20 | Disposition: A | Discharge status: Home / Self Care | Payer: Medicaid Other | Source: Ambulatory Visit | Attending: Family Medicine | Admitting: Family Medicine

## 2016-05-20 ENCOUNTER — Encounter (HOSPITAL_COMMUNITY): Payer: Self-pay | Admitting: *Deleted

## 2016-05-20 DIAGNOSIS — O211 Hyperemesis gravidarum with metabolic disturbance: Secondary | ICD-10-CM | POA: Insufficient documentation

## 2016-05-20 DIAGNOSIS — O99011 Anemia complicating pregnancy, first trimester: Secondary | ICD-10-CM | POA: Diagnosis not present

## 2016-05-20 DIAGNOSIS — Z3A14 14 weeks gestation of pregnancy: Secondary | ICD-10-CM | POA: Insufficient documentation

## 2016-05-20 DIAGNOSIS — O99012 Anemia complicating pregnancy, second trimester: Secondary | ICD-10-CM

## 2016-05-20 DIAGNOSIS — R1032 Left lower quadrant pain: Secondary | ICD-10-CM | POA: Insufficient documentation

## 2016-05-20 DIAGNOSIS — R109 Unspecified abdominal pain: Secondary | ICD-10-CM | POA: Diagnosis not present

## 2016-05-20 DIAGNOSIS — Z87891 Personal history of nicotine dependence: Secondary | ICD-10-CM | POA: Insufficient documentation

## 2016-05-20 DIAGNOSIS — O219 Vomiting of pregnancy, unspecified: Secondary | ICD-10-CM

## 2016-05-20 DIAGNOSIS — E860 Dehydration: Secondary | ICD-10-CM

## 2016-05-20 DIAGNOSIS — O9989 Other specified diseases and conditions complicating pregnancy, childbirth and the puerperium: Secondary | ICD-10-CM | POA: Diagnosis not present

## 2016-05-20 DIAGNOSIS — R197 Diarrhea, unspecified: Secondary | ICD-10-CM | POA: Diagnosis not present

## 2016-05-20 DIAGNOSIS — D649 Anemia, unspecified: Secondary | ICD-10-CM | POA: Insufficient documentation

## 2016-05-20 DIAGNOSIS — O34219 Maternal care for unspecified type scar from previous cesarean delivery: Secondary | ICD-10-CM | POA: Insufficient documentation

## 2016-05-20 DIAGNOSIS — E876 Hypokalemia: Secondary | ICD-10-CM

## 2016-05-20 DIAGNOSIS — O26899 Other specified pregnancy related conditions, unspecified trimester: Secondary | ICD-10-CM

## 2016-05-20 LAB — CBC
HEMATOCRIT: 29.8 % — AB (ref 36.0–46.0)
Hemoglobin: 9.8 g/dL — ABNORMAL LOW (ref 12.0–15.0)
MCH: 21.8 pg — ABNORMAL LOW (ref 26.0–34.0)
MCHC: 32.9 g/dL (ref 30.0–36.0)
MCV: 66.4 fL — AB (ref 78.0–100.0)
Platelets: 278 10*3/uL (ref 150–400)
RBC: 4.49 MIL/uL (ref 3.87–5.11)
RDW: 20.7 % — AB (ref 11.5–15.5)
WBC: 6.6 10*3/uL (ref 4.0–10.5)

## 2016-05-20 LAB — WET PREP, GENITAL
Sperm: NONE SEEN
Trich, Wet Prep: NONE SEEN
Yeast Wet Prep HPF POC: NONE SEEN

## 2016-05-20 LAB — COMPREHENSIVE METABOLIC PANEL
ALK PHOS: 42 U/L (ref 38–126)
ALT: 12 U/L — ABNORMAL LOW (ref 14–54)
ANION GAP: 8 (ref 5–15)
AST: 19 U/L (ref 15–41)
Albumin: 3.3 g/dL — ABNORMAL LOW (ref 3.5–5.0)
BILIRUBIN TOTAL: 0.4 mg/dL (ref 0.3–1.2)
BUN: 9 mg/dL (ref 6–20)
CALCIUM: 9.3 mg/dL (ref 8.9–10.3)
CO2: 23 mmol/L (ref 22–32)
Chloride: 103 mmol/L (ref 101–111)
Creatinine, Ser: 0.46 mg/dL (ref 0.44–1.00)
GLUCOSE: 88 mg/dL (ref 65–99)
Potassium: 3.3 mmol/L — ABNORMAL LOW (ref 3.5–5.1)
Sodium: 134 mmol/L — ABNORMAL LOW (ref 135–145)
TOTAL PROTEIN: 7.4 g/dL (ref 6.5–8.1)

## 2016-05-20 LAB — URINE MICROSCOPIC-ADD ON: RBC / HPF: NONE SEEN RBC/hpf (ref 0–5)

## 2016-05-20 LAB — URINALYSIS, ROUTINE W REFLEX MICROSCOPIC
Bilirubin Urine: NEGATIVE
Glucose, UA: NEGATIVE mg/dL
HGB URINE DIPSTICK: NEGATIVE
Ketones, ur: >80 mg/dL — AB
Nitrite: NEGATIVE
Protein, ur: NEGATIVE mg/dL
SPECIFIC GRAVITY, URINE: 1.015 (ref 1.005–1.030)
pH: 6 (ref 5.0–8.0)

## 2016-05-20 MED ORDER — FERROUS SULFATE 325 (65 FE) MG PO TABS: 325.0000 mg | ORAL_TABLET | Freq: Two times a day (BID) | ORAL | Status: DC

## 2016-05-20 MED ORDER — DEXTROSE 5 % IN LACTATED RINGERS IV BOLUS
1000.0000 mL | Freq: Once | INTRAVENOUS | Status: AC
  Administered 2016-05-20: 1000 mL via INTRAVENOUS

## 2016-05-20 MED ORDER — FAMOTIDINE IN NACL 20-0.9 MG/50ML-% IV SOLN
20.0000 mg | Freq: Once | INTRAVENOUS | Status: AC
  Administered 2016-05-20: 20 mg via INTRAVENOUS
  Filled 2016-05-20: qty 50

## 2016-05-20 MED ORDER — POTASSIUM CHLORIDE ER 20 MEQ PO TBCR: 20.0000 meq | EXTENDED_RELEASE_TABLET | Freq: Every day | ORAL | Status: DC

## 2016-05-20 MED ORDER — ONDANSETRON HCL 4 MG/2ML IJ SOLN
4.0000 mg | Freq: Once | INTRAMUSCULAR | Status: AC
  Administered 2016-05-20: 4 mg via INTRAVENOUS
  Filled 2016-05-20: qty 2

## 2016-05-20 MED ORDER — ONDANSETRON 4 MG PO TBDP: 4.0000 mg | ORAL_TABLET | Freq: Three times a day (TID) | ORAL | Status: DC | PRN

## 2016-05-20 NOTE — MAU Provider Note (Signed)
History     CSN: [id number]  Arrival date and time: 05/20/16 1031   First Provider Initiated Contact with Patient 05/20/16 1211      Chief Complaint  Patient presents with  . Abdominal Pain  . Back Pain   HPI   Amber Howard is a 20 y.o. female with a history of anemia G6P1220 @ 11w6dpresenting with LLQ pain which radiates to her lower back.  The pain is constant, and feels very sensitive when she touches it.  She has not taken anything for the pain. She has had diarrhea every day; once per day. No other GI symptoms.   Feels nauseated with decreased appetite.   Denies vaginal bleeding.   OB History    Gravida Para Term Preterm AB TAB SAB Ectopic Multiple Living   _0 Past Medical History  Diagnosis Date  . Anemia   . Fibroids     Past Surgical History  Procedure Laterality Date  . Cesarean section  2013  . Nose surgery  2015    Family History  Problem Relation Age of Onset  . Anemia Mother   . Fibroids Mother     Social History  Substance Use Topics  . Smoking status: Former Smoker    Types: Cigarettes    Quit date: 10/20/2014  . Smokeless tobacco: Never Used  . Alcohol Use: No    Allergies:  Allergies  Allergen Reactions  . 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 Onion Anaphylaxis  . Venomil Honey Bee Venom [Honey Bee Venom] Anaphylaxis  . MMadelaine BhatIsothiocyanate] Hives  . Other Hives    Ant bite (fire)  Oranges cause itching and throat swelling    Prescriptions prior to admission  Medication Sig Dispense Refill Last Dose  . Prenatal Vit-Fe Fumarate-FA (PRENATAL VITAMINS) 28-0.8 MG TABS Take 1 tablet by mouth daily. 30 tablet 9 05/20/2016 at Unknown time  . [DISCONTINUED] bacitracin ointment Apply 1 application topically 2 (two) times daily. (Patient not taking: Reported on 05/20/2016) 120 g 0 Taking   Results for orders placed or performed during the hospital encounter of 05/20/16 (from the past 48 hour(s))  Urinalysis,  [facility]     Status: Abnormal   Collection Time: 05/20/16 11:45 AM  Result Value Ref Range   Color, Urine YELLOW YELLOW   APPearance HAZY (A) CLEAR   Specific Gravity, Urine 1.015 1.005 - 1.030   pH 6.0 5.0 - 8.0   Glucose, UA NEGATIVE NEGATIVE mg/dL   Hgb urine dipstick NEGATIVE NEGATIVE   Bilirubin Urine NEGATIVE NEGATIVE   Ketones, ur >80 (A) NEGATIVE mg/dL   Protein, ur NEGATIVE NEGATIVE mg/dL   Nitrite NEGATIVE NEGATIVE   Leukocytes, UA SMALL (A) NEGATIVE  Urine microscopic-add on     Status: Abnormal   Collection Time: 05/20/16 11:45 AM  Result Value Ref Range   Squamous Epithelial / LPF 6-30 (A) NONE SEEN   WBC, UA 0-5 0 - 5 WBC/hpf   RBC / HPF NONE SEEN 0 - 5 RBC/hpf   Bacteria, UA FEW (A) NONE SEEN  Wet prep, genital     Status: Abnormal   Collection Time: 05/20/16 12:20 PM  Result Value Ref Range   Yeast Wet Prep HPF POC NONE SEEN NONE SEEN   Trich, Wet Prep NONE SEEN NONE SEEN   Clue Cells Wet Prep HPF POC PRESENT (A) NONE SEEN   WBC, Wet  Prep HPF POC MANY (A) NONE SEEN    Comment: BACTERIA- TOO NUMEROUS TO COUNT   Sperm NONE SEEN   CBC     Status: Abnormal   Collection Time: 05/20/16 12:35 PM  Result Value Ref Range   WBC 6.6 4.0 - 10.5 K/uL   RBC 4.49 3.87 - 5.11 MIL/uL   Hemoglobin 9.8 (L) 12.0 - 15.0 g/dL   HCT 29.8 (L) 36.0 - 46.0 %   MCV 66.4 (L) 78.0 - 100.0 fL   MCH 21.8 (L) 26.0 - 34.0 pg   MCHC 32.9 30.0 - 36.0 g/dL   RDW 20.7 (H) 11.5 - 15.5 %   Platelets 278 150 - 400 K/uL  Comprehensive metabolic panel     Status: Abnormal   Collection Time: 05/20/16 12:35 PM  Result Value Ref Range   Sodium 134 (L) 135 - 145 mmol/L   Potassium 3.3 (L) 3.5 - 5.1 mmol/L   Chloride 103 101 - 111 mmol/L   CO2 23 22 - 32 mmol/L   Glucose, Bld 88 65 - 99 mg/dL   BUN 9 6 - 20 mg/dL   Creatinine, Ser 0.46 0.44 - 1.00 mg/dL   Calcium 9.3 8.9 - 10.3 mg/dL   Total Protein 7.4 6.5 - 8.1 g/dL   Albumin 3.3 (L) 3.5 - 5.0 g/dL   AST 19  15 - 41 U/L   ALT 12 (L) 14 - 54 U/L   Alkaline Phosphatase 42 38 - 126 U/L   Total Bilirubin 0.4 0.3 - 1.2 mg/dL   GFR calc non Af Amer >60 >60 mL/min   GFR calc Af Amer >60 >60 mL/min    Comment: (NOTE) The eGFR has been calculated using the CKD EPI equation. This calculation has not been validated in all clinical situations. eGFR's persistently <60 mL/min signify possible Chronic Kidney Disease.    Anion gap 8 5 - 15    Review of Systems  Constitutional: Negative for fever and chills.  Gastrointestinal: Positive for nausea, vomiting (4X every day. ), abdominal pain and diarrhea.  Genitourinary: Negative for dysuria, urgency, frequency and hematuria.   Physical Exam   Blood pressure 121/64, pulse 76, temperature 98.1 F (36.7 C), temperature source Oral, resp. rate 18, weight 200 lb 6.4 oz (90.901 kg), unknown if currently breastfeeding.  Physical Exam  Constitutional: She is oriented to person, place, and time. She appears well-developed and well-nourished. No distress.  HENT:  Head: Normocephalic.  Eyes: Pupils are equal, round, and reactive to light.  GI: Soft. Normal appearance. There is tenderness in the periumbilical area, suprapubic area and left lower quadrant. There is no rigidity, no rebound and no guarding.  Genitourinary:  Speculum exam: Vagina - Small amount of creamy discharge, no odor Cervix - No contact bleeding Bimanual exam: Cervix closed, no CMT  + Left adnexal tenderness  GC/Chlam, wet prep done Chaperone present for exam.  Musculoskeletal: Normal range of motion.  Neurological: She is alert and oriented to person, place, and time.  Skin: Skin is warm. She is not diaphoretic.  Psychiatric: Her behavior is normal.    MAU Course  Procedures  None  MDM  + fetal heart tones via doppler D5LR bolus X1 Zofran 4 mg IV Pepcid 20 IV  Patient tolerating PO fluids and crackers.   Assessment and Plan   A:  1. Hypokalemia   2. Anemia in  pregnancy, second trimester   3. Diarrhea in adult patient   4. Nausea/vomiting in pregnancy   5. Abdominal  pain in pregnancy   6. Dehydration     P:  Discharge home in stable condition  Rx: Kdur X 5 days        Iron BID        Zofran  Ok to use imodium over the counter as directed on the bottle  Small, frequent meals BRAT diet Increase PO fluids  Start prenatal care Prenatal vitamins daily Patient encouraged to call the HD or WOC to start care.    Lezlie Lye, NP 05/20/2016 12:14 PM

## 2016-05-20 NOTE — MAU Note (Addendum)
For a couple of days has been having pains in left lower abd and side, and left side of back (all the way up). Sometimes she gets  "stuck standing up", can't straighten up.  Denies urinary symptoms.  Vomited yesterday but not today, has had diarrhea the last 5 days- only 2 or 3 times a day.  Her son this morning had diarrhea.  'the whole house has been real sick lately"

## 2016-05-20 NOTE — Discharge Instructions (Signed)

## 2016-05-20 NOTE — Progress Notes (Signed)
CSW called by MAU RN to provide bus passes to patient and her husband.  Upon arrival, RN informed CSW that patient and husband are asking about "emergency housing."  CSW met with patient and FOB/Darren Leak who state they moved to [place] from [place] approximately 2 weeks ago to live with FOB's mother.  MOB reports that the home is overcrowded and they no longer wish to stay there.  Patient reports that they were most recently living at the [facility] prior to coming to [place].  They do not identify any other family supports.  CSW explained that housing is limited and often not rapidly available.  CSW provided patient and FOB with housing resources including an income based housing list, shelter phone numbers, phone number to the [facility]).  CSW asked if they are willing to go to separate places as patient may qualify to stay at [facility] the [facility], but this is not a program where men can reside.  FOB quickly stated that they are married.  CSW stated understanding and no intention of splitting them up, however, explained that some housing options are only available to women and children.  Patient and FOB stated understanding and report that they are willing to separate if absolutely necessary.  FOB does not seem concerned about himself, but states his main concern is his wife and children.  They report that they have 25 month old twins and a 22 year old.  FOB has a 22 year old by another woman, and states that child will be going back to his mother.  CSW called [facility] to inquire about availability for patient and children.  CSW spoke with Hassan Rowan, who states they do not have a bed available at this time, but will do a phone intake with patient if patient desires.  CSW provided patient with Brenda's contact information.  CSW encouraged patient and FOB to contact the resources provided by CSW.  CSW provided them with two bus passes at patient's discharge.

## 2016-05-20 NOTE — MAU Note (Signed)
Amber Howard, Education officer, museum, in to see pt.

## 2016-05-20 NOTE — Progress Notes (Signed)
Written and verbal d/c instructions given and understanding voiced. Social worker, Press photographer, called for bus passes for pt and her husband. THey came in EMS and have no transportation. Jaclyn Shaggy will see pt

## 2016-05-21 LAB — GC/CHLAMYDIA PROBE AMP (~~LOC~~) NOT AT ARMC
Chlamydia: NEGATIVE
NEISSERIA GONORRHEA: NEGATIVE

## 2016-05-23 ENCOUNTER — Encounter: Payer: Self-pay | Admitting: *Deleted

## 2016-06-17 ENCOUNTER — Telehealth: Payer: Self-pay | Admitting: Obstetrics & Gynecology

## 2016-06-17 NOTE — Telephone Encounter (Signed)
Patient called and stated 7:45 was too early, and if we had a later time. I informed her that as a New OB this is the time she would need to come. Patient asked about medical transportation, and if we offered that. I informed her that she would need to talk to her case worker to get setup for Medicaid transportation. However, I did suggest she call and see about public transportation. Patient thanked me, and hung up.

## 2016-06-19 ENCOUNTER — Encounter: Payer: Self-pay | Admitting: Obstetrics & Gynecology

## 2016-06-19 ENCOUNTER — Ambulatory Visit (INDEPENDENT_AMBULATORY_CARE_PROVIDER_SITE_OTHER): Payer: Medicaid Other | Admitting: Obstetrics & Gynecology

## 2016-06-19 VITALS — BP 108/63 | HR 88 | Wt 204.5 lb

## 2016-06-19 DIAGNOSIS — O09219 Supervision of pregnancy with history of pre-term labor, unspecified trimester: Secondary | ICD-10-CM | POA: Insufficient documentation

## 2016-06-19 DIAGNOSIS — O09212 Supervision of pregnancy with history of pre-term labor, second trimester: Secondary | ICD-10-CM

## 2016-06-19 DIAGNOSIS — O219 Vomiting of pregnancy, unspecified: Secondary | ICD-10-CM

## 2016-06-19 DIAGNOSIS — Z3491 Encounter for supervision of normal pregnancy, unspecified, first trimester: Secondary | ICD-10-CM

## 2016-06-19 DIAGNOSIS — O99012 Anemia complicating pregnancy, second trimester: Secondary | ICD-10-CM

## 2016-06-19 DIAGNOSIS — E669 Obesity, unspecified: Secondary | ICD-10-CM

## 2016-06-19 DIAGNOSIS — O99212 Obesity complicating pregnancy, second trimester: Secondary | ICD-10-CM

## 2016-06-19 DIAGNOSIS — Z3689 Encounter for other specified antenatal screening: Secondary | ICD-10-CM

## 2016-06-19 DIAGNOSIS — D649 Anemia, unspecified: Secondary | ICD-10-CM

## 2016-06-19 DIAGNOSIS — O99210 Obesity complicating pregnancy, unspecified trimester: Secondary | ICD-10-CM | POA: Insufficient documentation

## 2016-06-19 LAB — COMPREHENSIVE METABOLIC PANEL
ALT: 9 U/L (ref 6–29)
AST: 15 U/L (ref 10–30)
Albumin: 3.3 g/dL — ABNORMAL LOW (ref 3.6–5.1)
Alkaline Phosphatase: 41 U/L (ref 33–115)
BUN: 9 mg/dL (ref 7–25)
CHLORIDE: 104 mmol/L (ref 98–110)
CO2: 20 mmol/L (ref 20–31)
Calcium: 8.3 mg/dL — ABNORMAL LOW (ref 8.6–10.2)
Creat: 0.48 mg/dL — ABNORMAL LOW (ref 0.50–1.10)
Glucose, Bld: 79 mg/dL (ref 65–99)
POTASSIUM: 3.8 mmol/L (ref 3.5–5.3)
Sodium: 135 mmol/L (ref 135–146)
TOTAL PROTEIN: 6.2 g/dL (ref 6.1–8.1)
Total Bilirubin: 0.2 mg/dL (ref 0.2–1.2)

## 2016-06-19 LAB — CBC
HEMATOCRIT: 29.1 % — AB (ref 35.0–45.0)
HEMOGLOBIN: 9.3 g/dL — AB (ref 11.7–15.5)
MCH: 22.2 pg — AB (ref 27.0–33.0)
MCHC: 32 g/dL (ref 32.0–36.0)
MCV: 69.5 fL — AB (ref 80.0–100.0)
MPV: 10 fL (ref 7.5–12.5)
Platelets: 260 10*3/uL (ref 140–400)
RBC: 4.19 MIL/uL (ref 3.80–5.10)
RDW: 20.4 % — ABNORMAL HIGH (ref 11.0–15.0)
WBC: 5.9 10*3/uL (ref 3.8–10.8)

## 2016-06-19 LAB — POCT URINALYSIS DIP (DEVICE)
BILIRUBIN URINE: NEGATIVE
Glucose, UA: NEGATIVE mg/dL
HGB URINE DIPSTICK: NEGATIVE
KETONES UR: NEGATIVE mg/dL
LEUKOCYTES UA: NEGATIVE
Nitrite: NEGATIVE
PH: 6 (ref 5.0–8.0)
Protein, ur: NEGATIVE mg/dL
Urobilinogen, UA: 0.2 mg/dL (ref 0.0–1.0)

## 2016-06-19 MED ORDER — PRENATAL VITAMINS 28-0.8 MG PO TABS: 1.0000 | ORAL_TABLET | Freq: Every day | ORAL | Status: DC

## 2016-06-19 MED ORDER — FERROUS SULFATE 325 (65 FE) MG PO TABS: 325.0000 mg | ORAL_TABLET | Freq: Three times a day (TID) | ORAL | Status: DC

## 2016-06-19 MED ORDER — ONDANSETRON 4 MG PO TBDP: 4.0000 mg | ORAL_TABLET | Freq: Three times a day (TID) | ORAL | Status: DC | PRN

## 2016-06-19 NOTE — Progress Notes (Signed)
Pt reports that she was supposed to have iron transfusion but never did. Pt passed out last week.

## 2016-06-19 NOTE — Patient Instructions (Signed)

## 2016-06-19 NOTE — Progress Notes (Signed)
U/S scheduled 06/26 @ 3pm.

## 2016-06-19 NOTE — Progress Notes (Signed)
Subjective:  Amber Howard is a 20 y.o. [phone] at 47w1dbeing seen today [place]  She is currently monitored for the following issues for this low-risk pregnancy and has Previous cesarean delivery x 2 affecting pregnancy, antepartum; Supervision of normal pregnancy in first trimester; Previous preterm delivery of twins due to IUGR, antepartum; and Obesity in pregnancy, antepartum on her problem list.  Patient reports syncope last week, attruibuted to anemia. Initial Hgb was 9.2 as per records, and she was given iron tablets which she has not been compliant with. Reports she was being scheduled for IV iron prior to transfer of care. No presyncopal symptoms now. No effects from fall.  Also occasional nausea and vomiting.  Contractions: Not present. Vag. Bleeding: None.  Movement: Absent. Denies leaking of fluid. Desires female providers.  Past Medical History  Diagnosis Date  . Anemia   . Fibroids     Past Surgical History  Procedure Laterality Date  . Cesarean section  2013  . Nose surgery  2015    Obstetric History   G5   P2   T1   P1   A2   TAB0   SAB2   E0   M1   L2     # Outcome Date GA Lbr Len/2nd Weight Sex Delivery Anes PTL Lv  5 Current           4A Preterm 07/07/15   4 lb 3 oz (1.899 kg) M CS-LTranv  Y      Complications: IUGR (intrauterine growth restriction)  4B Preterm    4 lb 7 oz (2.013 kg) F CS-LTranv   Y     Complications: IUGR (intrauterine growth restriction),Breech presentation at birth  383Term 01/25/12 457w0d6 lb 11 oz (3.033 kg) M CS-Unspec   Y  2 SAB           1 SAB               The following portions of the patient's history were reviewed and updated as appropriate: allergies, current medications, past family history, past medical history, past social history, past surgical history and problem list. Problem list updated.  Objective:   Filed Vitals:   06/19/16 0815  BP: 108/63  Pulse: 88   Weight: 204 lb 8 oz (92.761 kg)    Fetal Status: Fetal Heart Rate (bpm): 145 Fundal Height: 19 cm Movement: Absent     General:  Alert, oriented and cooperative. Patient is in no acute distress.  Skin: Skin is warm and dry. No rash noted.   Cardiovascular: Normal heart rate noted  Respiratory: Normal respiratory effort, no problems with respiration noted  Abdomen: Soft, gravid, appropriate for gestational age. Pain/Pressure: Absent     Pelvic: Cervical exam deferred        Extremities: Normal range of motion.  Edema: None  Mental Status: Normal mood and affect. Normal behavior. Normal judgment and thought content.   Urinalysis: Urine Protein: Negative Urine Glucose: Negative  Assessment and Plan:  Pregnancy: [id number] 1962w1d. Previous preterm delivery of twins due to IUGR, antepartum, second trimester Not a 17P candidate  2. Obesity in pregnancy, antepartum, second trimester Early glucola today, already received Nutritional counseling. Recommended not more than 15 lb weight gain. - Glucose Tolerance, 1 HR (50g)  3. Vomiting affecting pregnancy, antepartum - ondansetron (ZOFRAN ODT) 4 MG disintegrating tablet; Take 1 tablet (4 mg total) by mouth every  8 (eight) hours as needed for nausea or vomiting.  Dispense: 20 tablet; Refill: 3 - Comp Met (CMET)  4. Anemia affecting pregnancy in second trimester Will check labs and manage accordingly - CBC - ferrous sulfate 325 (65 FE) MG tablet; Take 1 tablet (325 mg total) by mouth 3 (three) times daily with meals.  Dispense: 90 tablet; Refill: 3  5. Encounter for fetal anatomic survey Anatomy scan ordered - US MFM OB COMP + 14 WK; Future  6. Supervision of normal pregnancy in first trimester - POCT urinalysis dip (device) - AFP, Quad Screen - Prenatal Vit-Fe Fumarate-FA (PRENATAL VITAMINS) 28-0.8 MG TABS; Take 1 tablet by mouth daily.  Dispense: 30 tablet; Refill: 9 - [place] MFM OB COMP + 14 WK; Future  The nature of [facility] with multiple MDs and other Advanced Practice Providers was explained to patient; also emphasized that residents, students are part of our team. Discussed with patient that this is a teaching facility and that during their pregnancy there may be female physicians and other healthcare providers involved in their care. This includes, but is not limited to, prenatal visits and ultrasound examinations, as well as, the labor and delivery process and postpartum care. Also discussed with patient that they do have the right to transfer their care to another practice in the event that they do not agree or wish to see female providers under any situation. Informed patient that we will make every attempt to have a female provider care for them, though this cannot be guaranteed, such as in an emergent situation. Also, reminded patient that when they are scheduling their appointments, to request a female provider each time and we will try to accommodate their request.  Routine obstetric precautions reviewed. Please refer to After Visit Summary for other counseling recommendations.  Return in about 4 weeks (around 07/17/2016) for OB Visit (low risk pregnancy).   Osborne Oman, MD

## 2016-06-20 LAB — AFP, QUAD SCREEN
AFP: 42.6 ng/mL
Age Alone: 1:1150 {titer}
Curr Gest Age: 19.1 weeks
HCG TOTAL: 17.09 [IU]/mL
INH: 122.6 pg/mL
Interpretation-AFP: NEGATIVE
MOM FOR AFP: 0.91
MOM FOR HCG: 0.85
MOM FOR INH: 0.82
OPEN SPINA BIFIDA: NEGATIVE
Osb Risk: 1:37100 {titer}
Tri 18 Scr Risk Est: NEGATIVE
UE3 MOM: 1.16
uE3 Value: 1.72 ng/mL

## 2016-06-20 LAB — GLUCOSE TOLERANCE, 1 HOUR (50G) W/O FASTING: GLUCOSE, 1 HR, GESTATIONAL: 89 mg/dL (ref <140)

## 2016-06-23 ENCOUNTER — Ambulatory Visit (HOSPITAL_COMMUNITY): Admission: RE | Admit: 2016-06-23 | Payer: Medicaid Other | Source: Ambulatory Visit

## 2016-06-24 ENCOUNTER — Ambulatory Visit (HOSPITAL_COMMUNITY)
Admission: RE | Admit: 2016-06-24 | Discharge: 2016-06-24 | Disposition: A | Discharge status: Home / Self Care | Payer: Medicaid Other | Source: Ambulatory Visit | Attending: Obstetrics & Gynecology | Admitting: Obstetrics & Gynecology

## 2016-06-24 DIAGNOSIS — O34219 Maternal care for unspecified type scar from previous cesarean delivery: Secondary | ICD-10-CM | POA: Insufficient documentation

## 2016-06-24 DIAGNOSIS — Z36 Encounter for antenatal screening of mother: Secondary | ICD-10-CM | POA: Insufficient documentation

## 2016-06-24 DIAGNOSIS — Z3481 Encounter for supervision of other normal pregnancy, first trimester: Secondary | ICD-10-CM | POA: Insufficient documentation

## 2016-06-24 DIAGNOSIS — Z3689 Encounter for other specified antenatal screening: Secondary | ICD-10-CM

## 2016-06-24 DIAGNOSIS — O09212 Supervision of pregnancy with history of pre-term labor, second trimester: Secondary | ICD-10-CM | POA: Insufficient documentation

## 2016-06-24 DIAGNOSIS — Z3491 Encounter for supervision of normal pregnancy, unspecified, first trimester: Secondary | ICD-10-CM

## 2016-07-23 ENCOUNTER — Encounter: Payer: Medicaid Other | Admitting: Advanced Practice Midwife

## 2016-08-05 ENCOUNTER — Encounter (HOSPITAL_COMMUNITY): Payer: Self-pay

## 2016-08-05 ENCOUNTER — Inpatient Hospital Stay (HOSPITAL_COMMUNITY)
Admission: AD | Admit: 2016-08-05 | Discharge: 2016-08-05 | Disposition: A | Discharge status: Home / Self Care | Payer: Medicaid Other | Source: Ambulatory Visit | Attending: Obstetrics and Gynecology | Admitting: Obstetrics and Gynecology

## 2016-08-05 DIAGNOSIS — Z3A25 25 weeks gestation of pregnancy: Secondary | ICD-10-CM

## 2016-08-05 DIAGNOSIS — R1030 Lower abdominal pain, unspecified: Secondary | ICD-10-CM | POA: Diagnosis present

## 2016-08-05 DIAGNOSIS — O26892 Other specified pregnancy related conditions, second trimester: Secondary | ICD-10-CM | POA: Diagnosis present

## 2016-08-05 DIAGNOSIS — Z87891 Personal history of nicotine dependence: Secondary | ICD-10-CM | POA: Diagnosis not present

## 2016-08-05 DIAGNOSIS — Z888 Allergy status to other drugs, medicaments and biological substances status: Secondary | ICD-10-CM | POA: Insufficient documentation

## 2016-08-05 DIAGNOSIS — O2242 Hemorrhoids in pregnancy, second trimester: Secondary | ICD-10-CM

## 2016-08-05 DIAGNOSIS — Z886 Allergy status to analgesic agent status: Secondary | ICD-10-CM | POA: Diagnosis not present

## 2016-08-05 DIAGNOSIS — R197 Diarrhea, unspecified: Secondary | ICD-10-CM | POA: Diagnosis not present

## 2016-08-05 DIAGNOSIS — O368120 Decreased fetal movements, second trimester, not applicable or unspecified: Secondary | ICD-10-CM | POA: Diagnosis not present

## 2016-08-05 DIAGNOSIS — O09212 Supervision of pregnancy with history of pre-term labor, second trimester: Secondary | ICD-10-CM

## 2016-08-05 DIAGNOSIS — Z91030 Bee allergy status: Secondary | ICD-10-CM | POA: Diagnosis not present

## 2016-08-05 DIAGNOSIS — O99212 Obesity complicating pregnancy, second trimester: Secondary | ICD-10-CM

## 2016-08-05 DIAGNOSIS — O9989 Other specified diseases and conditions complicating pregnancy, childbirth and the puerperium: Secondary | ICD-10-CM | POA: Insufficient documentation

## 2016-08-05 DIAGNOSIS — K644 Residual hemorrhoidal skin tags: Secondary | ICD-10-CM

## 2016-08-05 DIAGNOSIS — O34219 Maternal care for unspecified type scar from previous cesarean delivery: Secondary | ICD-10-CM

## 2016-08-05 HISTORY — DX: Migraine, unspecified, not intractable, without status migrainosus: G43.909

## 2016-08-05 MED ORDER — SENNOSIDES-DOCUSATE SODIUM 8.6-50 MG PO TABS: 1.0000 | ORAL_TABLET | Freq: Two times a day (BID) | ORAL | 1 refills | Status: DC

## 2016-08-05 MED ORDER — PHENYLEPH-SHARK LIV OIL-MO-PET 0.25-3-14-71.9 % RE OINT: 1.0000 "application " | TOPICAL_OINTMENT | Freq: Two times a day (BID) | RECTAL | 0 refills | Status: DC | PRN

## 2016-08-05 NOTE — MAU Note (Signed)
Had swelling in feet that started 2-3 days ago.  Has had diarrhea the last 48 hours and noticed blood in the toilet from her rectum. Has had DFM today. Has not felt baby move since noon yesterday.

## 2016-08-05 NOTE — Discharge Instructions (Signed)

## 2016-08-05 NOTE — MAU Provider Note (Signed)
History     CSN: [id number]  Arrival date and time: 08/05/16 [id number]   First Provider Initiated Contact with Patient 08/05/16 [phone]       Chief Complaint  Patient presents with  . Decreased Fetal Movement  . Abdominal Pain   HPI  Patient is a 20 year old G5 P3 at 25 weeks and 6 days who presents with several complaints. She reports she's been having lower abdominal pain associated with bowel movements for several weeks. She has had to strain to have bowel movements and it has been painful causing burning near the rectum with bowel movements.She reports today she had some blood when she wiped and was very concerned. Over the past 72 hours she's had some nausea with no vomiting and for or so episodes of loose bowel movements of low volume daily. She reports drinking very little water. She did also mention she has had some minimal decreased fetal movement over the past 12 hours but this morning has felt the baby move a little more. She has not had any fevers or chills. And she denies any sick contacts with GI illnesses. She reports her twins have a viral upper respiratory tract infection. Patient does deny vaginal bleeding or any abnormal vaginal discharge. She reports occasional Braxton Hicks contractions. OB History    Gravida Para Term Preterm AB Living   5 2 1 1 2 3    SAB TAB Ectopic Multiple Live Births   2     1 3       Past Medical History:  Diagnosis Date  . Anemia   . Fibroids   . Migraine     Past Surgical History:  Procedure Laterality Date  . CESAREAN SECTION  2013  . NOSE SURGERY  2015    Family History  Problem Relation Age of Onset  . Anemia Mother   . Fibroids Mother     Social History  Substance Use Topics  . Smoking status: Former Smoker    Types: Cigarettes    Quit date: 10/20/2014  . Smokeless tobacco: Never Used  . Alcohol use No    Allergies:  Allergies  Allergen Reactions  . 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 Onion Anaphylaxis  . Venomil Honey Bee  Venom [Honey Bee Venom] Anaphylaxis  . [name] Isothiocyanate] Hives  . Other Hives    Ant bite (fire)  Oranges cause itching and throat swelling    Prescriptions Prior to Admission  Medication Sig Dispense Refill Last Dose  . ferrous sulfate 325 (65 FE) MG tablet Take 1 tablet (325 mg total) by mouth 3 (three) times daily with meals. 90 tablet 3   . ondansetron (ZOFRAN ODT) 4 MG disintegrating tablet Take 1 tablet (4 mg total) by mouth every 8 (eight) hours as needed for nausea or vomiting. 20 tablet 3   . potassium chloride 20 MEQ TBCR Take 20 mEq by mouth daily. (Patient not taking: Reported on 06/19/2016) 5 tablet 0 Not Taking  . Prenatal Vit-Fe Fumarate-FA (PRENATAL VITAMINS) 28-0.8 MG TABS Take 1 tablet by mouth daily. 30 tablet 9     Review of Systems  Constitutional: Negative for chills, fever and weight loss.  HENT: Negative for ear pain and tinnitus.   Eyes: Negative for blurred vision.  Respiratory: Negative for cough, hemoptysis and sputum production.   Cardiovascular: Negative for chest pain, palpitations, orthopnea and leg swelling.  Gastrointestinal: Positive for abdominal pain, constipation, diarrhea and nausea. Negative for heartburn and vomiting.  Genitourinary: Negative for dysuria and urgency.  Musculoskeletal: Negative for myalgias and neck pain.  Skin: Negative for itching and rash.  Neurological: Negative for dizziness, tingling and headaches.  Psychiatric/Behavioral: Negative for depression and substance abuse.   Physical Exam   Blood pressure (!) 105/50, pulse 89, temperature 98.9 F (37.2 C), temperature source Oral, resp. rate 16, last menstrual period 01/30/2016, unknown if currently breastfeeding.  Physical Exam  Constitutional: She is oriented to person, place, and time. She appears well-developed and well-nourished.  HENT:  Head: Normocephalic and atraumatic.  Eyes: EOM are normal. Pupils are equal, round, and reactive to light.  Neck: Normal  range of motion. Neck supple.  Cardiovascular: Normal rate and regular rhythm.   Respiratory: Effort normal and breath sounds normal.  GI: Soft. Bowel sounds are normal.  Tenderness to palpation with possible palpable stool in the left lower quadrant, gravid uterus so difficult to examine for constipation  Genitourinary:     Musculoskeletal: She exhibits no edema or tenderness.  Neurological: She is alert and oriented to person, place, and time.  Skin: Skin is warm and dry.    MAU Course  Procedures  MDM In the MAU patient underwent physical exam and had counseling regarding overflow diarrhea and the fact that likely she is constipated despite having liquid bowel movements. Patient is reassured and has no further questions. She is okay with discharge on stool softeners and close follow-up.  Assessment and Plan  #1: Overflow diarrhea. Recommend gentle stool softener and rehydration. Advised 10 bottles of water a day +7 Colace. The patient has no results and continues to abdominal pain and diarrhea she can try MiraLAX #2: External hemorrhoids. Advised she can use hemorrhoid cream preparation age in order to help with pain. #3 patient noted to have anemia in the past. Not taking iron. Advised she should take her iron but only after the constipation issue is resolved.   Amber Howard 08/05/2016, 9:08 AM

## 2016-08-05 NOTE — MAU Note (Signed)
Urine in lab 

## 2016-08-20 ENCOUNTER — Encounter: Payer: Medicaid Other | Admitting: Obstetrics & Gynecology

## 2016-08-20 ENCOUNTER — Encounter: Payer: Self-pay | Admitting: Obstetrics & Gynecology

## 2016-08-20 ENCOUNTER — Encounter: Payer: Medicaid Other | Admitting: Advanced Practice Midwife

## 2016-09-23 ENCOUNTER — Encounter: Payer: Self-pay | Admitting: Family Medicine

## 2016-09-23 ENCOUNTER — Telehealth: Payer: Self-pay | Admitting: Family Medicine

## 2016-09-23 NOTE — Telephone Encounter (Signed)
Patient called to say she was not feeling well. When I asked her why she had not been able to keep her appointments, she stated she had moved here from out of town, and had no transportation. I saw that she did have Medicaid, so I asked her had she tried to get [employer]. She stated she did know the name of her case worker. I got the information, and gave it to her to call them and try to set something up. I informed her that since she had not been seen since June, it was very important that she try to get here. Then transferred call to [place].

## 2016-09-24 ENCOUNTER — Ambulatory Visit (INDEPENDENT_AMBULATORY_CARE_PROVIDER_SITE_OTHER): Payer: Medicaid Other | Admitting: Family Medicine

## 2016-09-24 VITALS — BP 114/59 | HR 94 | Wt 206.0 lb

## 2016-09-24 DIAGNOSIS — O34219 Maternal care for unspecified type scar from previous cesarean delivery: Secondary | ICD-10-CM

## 2016-09-24 DIAGNOSIS — Z3481 Encounter for supervision of other normal pregnancy, first trimester: Secondary | ICD-10-CM

## 2016-09-24 DIAGNOSIS — Z3491 Encounter for supervision of normal pregnancy, unspecified, first trimester: Secondary | ICD-10-CM

## 2016-09-24 DIAGNOSIS — O0933 Supervision of pregnancy with insufficient antenatal care, third trimester: Secondary | ICD-10-CM

## 2016-09-24 LAB — CBC
HEMATOCRIT: 25.4 % — AB (ref 35.0–45.0)
HEMOGLOBIN: 8.1 g/dL — AB (ref 11.7–15.5)
MCH: 21.2 pg — ABNORMAL LOW (ref 27.0–33.0)
MCHC: 31.9 g/dL — ABNORMAL LOW (ref 32.0–36.0)
MCV: 66.5 fL — AB (ref 80.0–100.0)
MPV: 9.9 fL (ref 7.5–12.5)
Platelets: 239 10*3/uL (ref 140–400)
RBC: 3.82 MIL/uL (ref 3.80–5.10)
RDW: 16.6 % — AB (ref 11.0–15.0)
WBC: 5.8 10*3/uL (ref 3.8–10.8)

## 2016-09-24 NOTE — Progress Notes (Signed)
   PRENATAL VISIT NOTE  Subjective:  Amber Howard is a 20 y.o. [phone] at [redacted]w[redacted]d being seen today for ongoing prenatal care.  She is currently monitored for the following issues for this low-risk pregnancy and has Previous cesarean delivery x 2 affecting pregnancy, antepartum; Supervision of normal pregnancy in first trimester; Previous preterm delivery of twins due to IUGR, antepartum; and Obesity in pregnancy, antepartum on her problem list.  Patient reports no complaints.  Contractions: Not present. Vag. Bleeding: None.  Movement: Present. Denies leaking of fluid.   The following portions of the patient's history were reviewed and updated as appropriate: allergies, current medications, past family history, past medical history, past social history, past surgical history and problem list. Problem list updated.  Objective:   Vitals:   09/24/16 1052  BP: (!) 114/59  Pulse: 94  Weight: 206 lb (93.4 kg)    Fetal Status:     Movement: Present     General:  Alert, oriented and cooperative. Patient is in no acute distress.  Skin: Skin is warm and dry. No rash noted.   Cardiovascular: Normal heart rate noted  Respiratory: Normal respiratory effort, no problems with respiration noted  Abdomen: Soft, gravid, appropriate for gestational age. Pain/Pressure: Absent     Pelvic:  Cervical exam deferred        Extremities: Normal range of motion.  Edema: None  Mental Status: Normal mood and affect. Normal behavior. Normal judgment and thought content.   Urinalysis:      Assessment and Plan:  Pregnancy: [name] at 110w0d  1. Supervision of normal pregnancy in first trimester FHT and FH. - Glucose Tolerance, 1 HR (50g) w/o Fasting - CBC - RPR - HIV antibody (with reflex) - [place] MFM OB FOLLOW UP; Future  2. Previous cesarean delivery x 2 affecting pregnancy, antepartum  3. Insufficient PNC  Preterm labor symptoms and general obstetric precautions including but not limited to  vaginal bleeding, contractions, leaking of fluid and fetal movement were reviewed in detail with the patient. Please refer to After Visit Summary for other counseling recommendations.  No Follow-up on file.  Truett Mainland, DO

## 2016-09-24 NOTE — Progress Notes (Signed)
Pt has been out of care due to transportation.

## 2016-09-25 LAB — GLUCOSE TOLERANCE, 1 HOUR (50G) W/O FASTING: Glucose, 1 Hr, gestational: 89 mg/dL (ref <140)

## 2016-09-25 LAB — HIV ANTIBODY (ROUTINE TESTING W REFLEX): HIV: NONREACTIVE

## 2016-09-26 LAB — RPR

## 2016-10-06 ENCOUNTER — Encounter (HOSPITAL_COMMUNITY): Payer: Self-pay | Admitting: Emergency Medicine

## 2016-10-06 ENCOUNTER — Inpatient Hospital Stay (HOSPITAL_COMMUNITY)
Admission: EM | Admit: 2016-10-06 | Discharge: 2016-10-06 | Disposition: A | Discharge status: Home / Self Care | Payer: Medicaid Other | Attending: Obstetrics and Gynecology | Admitting: Obstetrics and Gynecology

## 2016-10-06 DIAGNOSIS — R51 Headache: Secondary | ICD-10-CM | POA: Insufficient documentation

## 2016-10-06 DIAGNOSIS — O99210 Obesity complicating pregnancy, unspecified trimester: Secondary | ICD-10-CM

## 2016-10-06 DIAGNOSIS — O09219 Supervision of pregnancy with history of pre-term labor, unspecified trimester: Secondary | ICD-10-CM

## 2016-10-06 DIAGNOSIS — Z87891 Personal history of nicotine dependence: Secondary | ICD-10-CM | POA: Diagnosis not present

## 2016-10-06 DIAGNOSIS — H53149 Visual discomfort, unspecified: Secondary | ICD-10-CM | POA: Insufficient documentation

## 2016-10-06 DIAGNOSIS — O26893 Other specified pregnancy related conditions, third trimester: Secondary | ICD-10-CM | POA: Insufficient documentation

## 2016-10-06 DIAGNOSIS — D509 Iron deficiency anemia, unspecified: Secondary | ICD-10-CM

## 2016-10-06 DIAGNOSIS — R109 Unspecified abdominal pain: Secondary | ICD-10-CM | POA: Insufficient documentation

## 2016-10-06 DIAGNOSIS — Z886 Allergy status to analgesic agent status: Secondary | ICD-10-CM | POA: Insufficient documentation

## 2016-10-06 DIAGNOSIS — Z3A34 34 weeks gestation of pregnancy: Secondary | ICD-10-CM | POA: Insufficient documentation

## 2016-10-06 DIAGNOSIS — R519 Headache, unspecified: Secondary | ICD-10-CM

## 2016-10-06 DIAGNOSIS — O34219 Maternal care for unspecified type scar from previous cesarean delivery: Secondary | ICD-10-CM | POA: Insufficient documentation

## 2016-10-06 DIAGNOSIS — J111 Influenza due to unidentified influenza virus with other respiratory manifestations: Secondary | ICD-10-CM

## 2016-10-06 LAB — CBC WITH DIFFERENTIAL/PLATELET
BASOS PCT: 0 %
Basophils Absolute: 0 10*3/uL (ref 0.0–0.1)
Eosinophils Absolute: 0 10*3/uL (ref 0.0–0.7)
Eosinophils Relative: 0 %
HCT: 22.8 % — ABNORMAL LOW (ref 36.0–46.0)
Hemoglobin: 7.4 g/dL — ABNORMAL LOW (ref 12.0–15.0)
Lymphocytes Relative: 23 %
Lymphs Abs: 1.9 10*3/uL (ref 0.7–4.0)
MCH: 21.1 pg — AB (ref 26.0–34.0)
MCHC: 32.5 g/dL (ref 30.0–36.0)
MCV: 65 fL — ABNORMAL LOW (ref 78.0–100.0)
MONOS PCT: 5 %
Monocytes Absolute: 0.4 10*3/uL (ref 0.1–1.0)
NEUTROS ABS: 5.8 10*3/uL (ref 1.7–7.7)
NEUTROS PCT: 72 %
PLATELETS: 215 10*3/uL (ref 150–400)
RBC: 3.51 MIL/uL — ABNORMAL LOW (ref 3.87–5.11)
RDW: 17.2 % — AB (ref 11.5–15.5)
WBC: 8.1 10*3/uL (ref 4.0–10.5)

## 2016-10-06 LAB — INFLUENZA PANEL BY PCR (TYPE A & B)
H1N1 flu by pcr: NOT DETECTED
Influenza A By PCR: NEGATIVE
Influenza B By PCR: NEGATIVE

## 2016-10-06 LAB — I-STAT CHEM 8, ED
BUN: 3 mg/dL — ABNORMAL LOW (ref 6–20)
CALCIUM ION: 1.16 mmol/L (ref 1.15–1.40)
CREATININE: 0.5 mg/dL (ref 0.44–1.00)
Chloride: 102 mmol/L (ref 101–111)
GLUCOSE: 84 mg/dL (ref 65–99)
HCT: 24 % — ABNORMAL LOW (ref 36.0–46.0)
HEMOGLOBIN: 8.2 g/dL — AB (ref 12.0–15.0)
POTASSIUM: 3.3 mmol/L — AB (ref 3.5–5.1)
Sodium: 137 mmol/L (ref 135–145)
TCO2: 23 mmol/L (ref 0–100)

## 2016-10-06 LAB — URINALYSIS, ROUTINE W REFLEX MICROSCOPIC
BILIRUBIN URINE: NEGATIVE
GLUCOSE, UA: NEGATIVE mg/dL
HGB URINE DIPSTICK: NEGATIVE
Ketones, ur: NEGATIVE mg/dL
Leukocytes, UA: NEGATIVE
Nitrite: NEGATIVE
PROTEIN: NEGATIVE mg/dL
Specific Gravity, Urine: 1.02 (ref 1.005–1.030)
pH: 6.5 (ref 5.0–8.0)

## 2016-10-06 LAB — LIPASE, BLOOD: Lipase: 25 U/L (ref 11–51)

## 2016-10-06 MED ORDER — SODIUM CHLORIDE 0.9 % IV SOLN: INTRAVENOUS | Status: DC

## 2016-10-06 MED ORDER — CYCLOBENZAPRINE HCL 5 MG PO TABS: 5.0000 mg | ORAL_TABLET | Freq: Three times a day (TID) | ORAL | Status: DC | PRN

## 2016-10-06 MED ORDER — DIPHENHYDRAMINE HCL 50 MG/ML IJ SOLN
12.5000 mg | Freq: Once | INTRAMUSCULAR | Status: AC
  Administered 2016-10-06: 12.5 mg via INTRAVENOUS
  Filled 2016-10-06: qty 1

## 2016-10-06 MED ORDER — SODIUM CHLORIDE 0.9 % IV BOLUS (SEPSIS)
1000.0000 mL | Freq: Once | INTRAVENOUS | Status: AC
  Administered 2016-10-06: 1000 mL via INTRAVENOUS

## 2016-10-06 MED ORDER — DEXAMETHASONE SODIUM PHOSPHATE 10 MG/ML IJ SOLN
10.0000 mg | Freq: Once | INTRAMUSCULAR | Status: AC
  Administered 2016-10-06: 10 mg via INTRAVENOUS
  Filled 2016-10-06: qty 1

## 2016-10-06 MED ORDER — METOCLOPRAMIDE HCL 5 MG/ML IJ SOLN
10.0000 mg | Freq: Once | INTRAMUSCULAR | Status: AC
  Administered 2016-10-06: 10 mg via INTRAVENOUS
  Filled 2016-10-06: qty 2

## 2016-10-06 MED ORDER — ACETAMINOPHEN 325 MG PO TABS
650.0000 mg | ORAL_TABLET | Freq: Once | ORAL | Status: AC
  Administered 2016-10-06: 650 mg via ORAL
  Filled 2016-10-06: qty 2

## 2016-10-06 MED ORDER — CYCLOBENZAPRINE HCL 10 MG PO TABS
10.0000 mg | ORAL_TABLET | Freq: Three times a day (TID) | ORAL | Status: DC | PRN
  Administered 2016-10-06: 10 mg via ORAL
  Filled 2016-10-06: qty 1

## 2016-10-06 MED ORDER — BUTALBITAL-APAP-CAFFEINE 50-325-40 MG PO TABS: 1.0000 | ORAL_TABLET | Freq: Four times a day (QID) | ORAL | 0 refills | Status: DC | PRN

## 2016-10-06 MED ORDER — LACTATED RINGERS IV BOLUS (SEPSIS)
1000.0000 mL | Freq: Once | INTRAVENOUS | Status: AC
  Administered 2016-10-06: 1000 mL via INTRAVENOUS

## 2016-10-06 MED ORDER — DIPHENHYDRAMINE HCL 50 MG/ML IJ SOLN
25.0000 mg | Freq: Once | INTRAMUSCULAR | Status: AC
  Administered 2016-10-06: 25 mg via INTRAVENOUS
  Filled 2016-10-06: qty 1

## 2016-10-06 MED ORDER — HYDROMORPHONE HCL 1 MG/ML IJ SOLN
1.0000 mg | Freq: Once | INTRAMUSCULAR | Status: AC
  Administered 2016-10-06: 1 mg via INTRAVENOUS
  Filled 2016-10-06: qty 1

## 2016-10-06 MED ORDER — OSELTAMIVIR PHOSPHATE 75 MG PO CAPS: 75.0000 mg | ORAL_CAPSULE | Freq: Two times a day (BID) | ORAL | 0 refills | Status: DC

## 2016-10-06 NOTE — Progress Notes (Signed)
Dr. Wilson Singer notifed that pt is to be transferred to [facility].

## 2016-10-06 NOTE — ED Triage Notes (Signed)
Brought in by EMS from home with c/o migraine headache and abdominal pain.  Pt is also 34-week pregnant--- with c/o 10/10 pain to abdomen with nausea and vomiting.  Pt denies vaginal discharges.

## 2016-10-06 NOTE — Progress Notes (Signed)
Report given to Dr. Rip Harbour. Pt is to be transferred to [facility], MAU.

## 2016-10-06 NOTE — Progress Notes (Signed)
Report given to Ginger Morris RN, MAU, charge nurse.

## 2016-10-06 NOTE — MAU Note (Signed)
EMS arrived with patient  

## 2016-10-06 NOTE — Discharge Instructions (Signed)
Please take Tamiflu (to treat Flu) twice a day for 5 days.  You can take Fioricet 1-2 tablets every 6 hours if you need to for headaches. Follow up as scheduled at the clinic on October 13th at 11 am.   Dehydration, Adult Dehydration is a condition in which you do not have enough fluid or water in your body. It happens when you take in less fluid than you lose. Vital organs such as the kidneys, brain, and heart cannot function without a proper amount of fluids. Any loss of fluids from the body can cause dehydration.  Dehydration can range from mild to severe. This condition should be treated right away to help prevent it from becoming severe. CAUSES  This condition may be caused by:  Vomiting.  Diarrhea.  Excessive sweating, such as when exercising in hot or humid weather.  Not drinking enough fluid during strenuous exercise or during an illness.  Excessive urine output.  Fever.  Certain medicines. RISK FACTORS This condition is more likely to develop in:  People who are taking certain medicines that cause the body to lose excess fluid (diuretics).   People who have a chronic illness, such as diabetes, that may increase urination.  Older adults.   People who live at high altitudes.   People who participate in endurance sports.  SYMPTOMS  Mild Dehydration  Thirst.  Dry lips.  Slightly dry mouth.  Dry, warm skin. Moderate Dehydration  Very dry mouth.   Muscle cramps.   Dark urine and decreased urine production.   Decreased tear production.   Headache.   Light-headedness, especially when you stand up from a sitting position.  Severe Dehydration  Changes in skin.   Cold and clammy skin.   Skin does not spring back quickly when lightly pinched and released.   Changes in body fluids.   Extreme thirst.   No tears.   Not able to sweat when body temperature is high, such as in hot weather.   Minimal urine production.   Changes in  vital signs.   Rapid, weak pulse (more than 100 beats per minute when you are sitting still).   Rapid breathing.   Low blood pressure.   Other changes.   Sunken eyes.   Cold hands and feet.   Confusion.  Lethargy and difficulty being awakened.  Fainting (syncope).   Short-term weight loss.   Unconsciousness. DIAGNOSIS  This condition may be diagnosed based on your symptoms. You may also have tests to determine how severe your dehydration is. These tests may include:   Urine tests.   Blood tests.  TREATMENT  Treatment for this condition depends on the severity. Mild or moderate dehydration can often be treated at home. Treatment should be started right away. Do not wait until dehydration becomes severe. Severe dehydration needs to be treated at the hospital. Treatment for Mild Dehydration  Drinking plenty of water to replace the fluid you have lost.   Replacing minerals in your blood (electrolytes) that you may have lost.  Treatment for Moderate Dehydration  Consuming oral rehydration solution (ORS). Treatment for Severe Dehydration  Receiving fluid through an IV tube.   Receiving electrolyte solution through a feeding tube that is passed through your nose and into your stomach (nasogastric tube or NG tube).  Correcting any abnormalities in electrolytes. HOME CARE INSTRUCTIONS   Drink enough fluid to keep your urine clear or pale yellow.   Drink water or fluid slowly by taking small sips. You can also try  sucking on ice cubes.  Have food or beverages that contain electrolytes. Examples include bananas and sports drinks.  Take over-the-counter and prescription medicines only as told by your health care provider.   Prepare ORS according to the manufacturer's instructions. Take sips of ORS every 5 minutes until your urine returns to normal.  If you have vomiting or diarrhea, continue to try to drink water, ORS, or both.   If you have  diarrhea, avoid:   Beverages that contain caffeine.   Fruit juice.   Milk.   Carbonated soft drinks.  Do not take salt tablets. This can lead to the condition of having too much sodium in your body (hypernatremia).  SEEK MEDICAL CARE IF:  You cannot eat or drink without vomiting.  You have had moderate diarrhea during a period of more than 24 hours.  You have a fever. SEEK IMMEDIATE MEDICAL CARE IF:   You have extreme thirst.  You have severe diarrhea.  You have not urinated in 6-8 hours, or you have urinated only a small amount of very dark urine.  You have shriveled skin.  You are dizzy, confused, or both.   This information is not intended to replace advice given to you by your health care provider. Make sure you discuss any questions you have with your health care provider.   Document Released: 12/15/2005 Document Revised: 09/05/2015 Document Reviewed: 05/02/2015 Elsevier Interactive Patient Education 2016 [employer].   Migraine Headache A migraine headache is an intense, throbbing pain on one or both sides of your head. A migraine can last for 30 minutes to several hours. CAUSES  The exact cause of a migraine headache is not always known. However, a migraine may be caused when nerves in the brain become irritated and release chemicals that cause inflammation. This causes pain. Certain things may also trigger migraines, such as:  Alcohol.  Smoking.  Stress.  Menstruation.  Aged cheeses.  Foods or drinks that contain nitrates, glutamate, aspartame, or tyramine.  Lack of sleep.  Chocolate.  Caffeine.  Hunger.  Physical exertion.  Fatigue.  Medicines used to treat chest pain (nitroglycerine), birth control pills, estrogen, and some blood pressure medicines. SIGNS AND SYMPTOMS  Pain on one or both sides of your head.  Pulsating or throbbing pain.  Severe pain that prevents daily activities.  Pain that is aggravated by any physical  activity.  Nausea, vomiting, or both.  Dizziness.  Pain with exposure to bright lights, loud noises, or activity.  General sensitivity to bright lights, loud noises, or smells. Before you get a migraine, you may get warning signs that a migraine is coming (aura). An aura may include:  Seeing flashing lights.  Seeing bright spots, halos, or zigzag lines.  Having tunnel vision or blurred vision.  Having feelings of numbness or tingling.  Having trouble talking.  Having muscle weakness. DIAGNOSIS  A migraine headache is often diagnosed based on:  Symptoms.  Physical exam.  A CT scan or MRI of your head. These imaging tests cannot diagnose migraines, but they can help rule out other causes of headaches. TREATMENT Medicines may be given for pain and nausea. Medicines can also be given to help prevent recurrent migraines.  HOME CARE INSTRUCTIONS  Only take over-the-counter or prescription medicines for pain or discomfort as directed by your health care provider. The use of long-term narcotics is not recommended.  Lie down in a dark, quiet room when you have a migraine.  Keep a journal to find out what  may trigger your migraine headaches. For example, write down:  What you eat and drink.  How much sleep you get.  Any change to your diet or medicines.  Limit alcohol consumption.  Quit smoking if you smoke.  Get 7-9 hours of sleep, or as recommended by your health care provider.  Limit stress.  Keep lights dim if bright lights bother you and make your migraines worse. SEEK IMMEDIATE MEDICAL CARE IF:   Your migraine becomes severe.  You have a fever.  You have a stiff neck.  You have vision loss.  You have muscular weakness or loss of muscle control.  You start losing your balance or have trouble walking.  You feel faint or pass out.  You have severe symptoms that are different from your first symptoms. MAKE SURE YOU:   Understand these  instructions.  Will watch your condition.  Will get help right away if you are not doing well or get worse.   This information is not intended to replace advice given to you by your health care provider. Make sure you discuss any questions you have with your health care provider.   Document Released: 12/15/2005 Document Revised: 01/05/2015 Document Reviewed: 08/22/2013 Elsevier Interactive Patient Education [employer].

## 2016-10-06 NOTE — MAU Note (Signed)
Pt states she had migraines in the past but has not had one for a long time. Had taken medication for it in the past, took tylenol but gave her no relief. Had nausea and vomiting last night and today.

## 2016-10-06 NOTE — ED Provider Notes (Signed)
[place] DEPT Provider Note   CSN: [id number] Arrival date & time: 10/06/16  0542     History   Chief Complaint Chief Complaint  Patient presents with  . Migraine  . Abdominal Pain  . 34-Week Pregnant    HPI Amber Howard is a 20 y.o. female.  20 year old female presents with 2 day history of frontal headache similar to her migraines. Has had some nausea and emesis. Her emesis has been nonbilious or bloody. No urinary symptoms. Denies any neck pain. Just prior to EMS arrival, patient developed mild photophobia. States that this is similar to her migraines. Denies any upper or lower extremity weakness. No rashes. No recent sick contacts but has had some URI symptoms. Patient also developed some abdominal cramping prior to arrival but denies any vaginal bleeding. Denies any contractions. EMS called and patient transported here      Past Medical History:  Diagnosis Date  . Anemia   . Fibroids   . Migraine     Patient Active Problem List   Diagnosis Date Noted  . Previous preterm delivery of twins due to IUGR, antepartum 06/19/2016  . Obesity in pregnancy, antepartum 06/19/2016  . Previous cesarean delivery x 2 affecting pregnancy, antepartum 10/24/2014  . Supervision of normal pregnancy in first trimester 10/24/2014    Past Surgical History:  Procedure Laterality Date  . CESAREAN SECTION  2013  . NOSE SURGERY  2015    OB History    Gravida Para Term Preterm AB Living   5 2 1 1 2 3    SAB TAB Ectopic Multiple Live Births   2     1 3        Home Medications    Prior to Admission medications   Medication Sig Start Date End Date Taking? Authorizing Provider  ferrous sulfate 325 (65 FE) MG tablet Take 1 tablet (325 mg total) by mouth 3 (three) times daily with meals. Patient not taking: Reported on 09/24/2016 06/19/16   Osborne Oman, MD  ondansetron (ZOFRAN ODT) 4 MG disintegrating tablet Take 1 tablet (4 mg total) by mouth every 8 (eight) hours as  needed for nausea or vomiting. Patient not taking: Reported on 09/24/2016 06/19/16   Osborne Oman, MD  phenylephrine-shark liver oil-mineral oil-petrolatum (PREPARATION H) 0.25-3-14-71.9 % rectal ointment Place 1 application rectally 2 (two) times daily as needed for hemorrhoids. Patient not taking: Reported on 09/24/2016 08/05/16   Waldemar Dickens, MD  potassium chloride 20 MEQ TBCR Take 20 mEq by mouth daily. Patient not taking: Reported on 09/24/2016 05/20/16   Lezlie Lye, NP  Prenatal Vit-Fe Fumarate-FA (PRENATAL VITAMINS) 28-0.8 MG TABS Take 1 tablet by mouth daily. Patient not taking: Reported on 09/24/2016 06/19/16   Osborne Oman, MD  senna-docusate (SENOKOT-S) 8.6-50 MG tablet Take 1 tablet by mouth 2 (two) times daily. Patient not taking: Reported on 09/24/2016 08/05/16   Waldemar Dickens, MD    Family History Family History  Problem Relation Age of Onset  . Anemia Mother   . Fibroids Mother     Social History Social History  Substance Use Topics  . Smoking status: Former Smoker    Types: Cigarettes    Quit date: 10/20/2014  . Smokeless tobacco: Never Used  . Alcohol use No     Allergies   Asa [aspirin]; Onion; Venomil honey bee venom [honey bee venom]; [name] isothiocyanate]; and Other   Review of Systems Review of Systems  All other systems reviewed and are  negative.    Physical Exam Updated Vital Signs BP 123/74   Pulse 88   Temp 100.3 F (37.9 C) (Oral)   Resp 20   Ht 5\' 1"  (1.549 m)   Wt 93.4 kg   LMP 01/30/2016 (Within Weeks)   SpO2 96%   BMI 38.92 kg/m   Physical Exam  Constitutional: She is oriented to person, place, and time. She appears well-developed and well-nourished.  Non-toxic appearance. No distress.  HENT:  Head: Normocephalic and atraumatic.  Eyes: Conjunctivae, EOM and lids are normal. Pupils are equal, round, and reactive to light.  Neck: Normal range of motion. Neck supple. No neck rigidity. No tracheal  deviation and normal range of motion present. No thyroid mass present.  Cardiovascular: Normal rate, regular rhythm and normal heart sounds.  Exam reveals no gallop.   No murmur heard. Pulmonary/Chest: Effort normal and breath sounds normal. No stridor. No respiratory distress. She has no decreased breath sounds. She has no wheezes. She has no rhonchi. She has no rales.  Abdominal: Soft. Normal appearance and bowel sounds are normal. She exhibits no distension. There is no tenderness. There is no rebound and no CVA tenderness.  Musculoskeletal: Normal range of motion. She exhibits no edema or tenderness.  Neurological: She is alert and oriented to person, place, and time. She has normal strength. No cranial nerve deficit or sensory deficit. GCS eye subscore is 4. GCS verbal subscore is 5. GCS motor subscore is 6.  Skin: Skin is warm and dry. No abrasion and no rash noted.  Psychiatric: She has a normal mood and affect. Her speech is normal and behavior is normal.  Nursing note and vitals reviewed.    ED Treatments / Results  Labs (all labs ordered are listed, but only abnormal results are displayed) Labs Reviewed - No data to display  EKG  EKG Interpretation None       Radiology No results found.  Procedures Procedures (including critical care time)  Medications Ordered in ED Medications  0.9 %  sodium chloride infusion (not administered)  sodium chloride 0.9 % bolus 1,000 mL (not administered)  metoCLOPramide (REGLAN) injection 10 mg (not administered)  diphenhydrAMINE (BENADRYL) injection 12.5 mg (not administered)     Initial Impression / Assessment and Plan / ED Course  I have reviewed the triage vital signs and the nursing notes.  Pertinent labs & imaging results that were available during my care of the patient were reviewed by me and considered in my medical decision making (see chart for details).  Clinical Course    Patient medicated here with Reglan and  Benadryl as well as given 1 L of saline and does feel slightly better. Will order an additional liter. Case discussed with Dr.eure from OB as patient has had some contractions every 4 minutes. She was checked by the [facility] nurse and was found to have only a fingertip cervix. No vaginal bleeding. Dr.eure recommended 2 mg of hydromorphone along with 100% oxygen. I will start with 1 mg at this time. Patient's neck exam was repeated and she continues not have any meningismus signs. Do not think that this represents meningitis. The plan is that the patient will be transferred to [facility] hospital if her headache is not improved or she has increased contractions. Patient will be signed out to Dr. Wilson Singer  Final Clinical Impressions(s) / ED Diagnoses   Final diagnoses:  None    New Prescriptions New Prescriptions   No medications on file  Lacretia Leigh, MD 10/06/16 [phone]

## 2016-10-06 NOTE — ED Notes (Signed)
Dr Zenia Resides and Dr Elonda Husky discussed pt history; signs and symptoms and discussed plan of care; RROB also spoke with Dr Elonda Husky and gave report

## 2016-10-06 NOTE — MAU Provider Note (Signed)
History    CSN: [id number]  Arrival date and time: 10/06/16 1017  Chief Complaint  Patient presents with  . Migraine  . Abdominal Pain  . 34-Week Pregnant    Patient was transferred from [facility] ED, presented to ED this morning around 6 am after husband woke her up due to her feeling feverish to him. Her temperature at home was 101, EMS took it as 100.4, had temperature in [facility] and received tylenol. She has had a headache for the past 2 days that has been constant and worsening. Has tried tylenol at home with no relief. Feels like deep pressure in her head all over, worse on sides and back of head. Threw up once last night. Had photophobia this morning in ED. Has a remote history of migraines as a young girl but has not had any headache this severe. Her children were sick with a cold last week.   Has not had issues with current pregnancy. Has had 2 prior c-sections. Has had scattered pre-natal care, just moved here and had car troubles. Denies gDM and gHTN. Feels like she is having contractions. No blood, leaking of fluids, or rupture of membranes.      Migraine   Associated symptoms include abdominal pain, coughing, a fever, neck pain and photophobia. Pertinent negatives include no sore throat.  Abdominal Pain  Associated symptoms include a fever. Pertinent negatives include no dysuria or frequency.    OB History    Gravida Para Term Preterm AB Living   5 2 1 1 2 3    SAB TAB Ectopic Multiple Live Births   2     1 3       Past Medical History:  Diagnosis Date  . Anemia   . Fibroids   . Migraine     Past Surgical History:  Procedure Laterality Date  . CESAREAN SECTION  2013  . NOSE SURGERY  2015    Family History  Problem Relation Age of Onset  . Anemia Mother   . Fibroids Mother     Social History  Substance Use Topics  . Smoking status: Former Smoker    Types: Cigarettes    Quit date: 10/20/2014  . Smokeless tobacco: Never Used  . Alcohol use No     Allergies:  Allergies  Allergen Reactions  . 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 Onion Anaphylaxis  . Venomil Honey Bee Venom [Honey Bee Venom] Anaphylaxis  . [name] Isothiocyanate] Hives  . Other Hives    Ant bite (fire)  Oranges cause itching and throat swelling    Prescriptions Prior to Admission  Medication Sig Dispense Refill Last Dose  . ferrous sulfate 325 (65 FE) MG tablet Take 1 tablet (325 mg total) by mouth 3 (three) times daily with meals. 90 tablet 3 Not Taking  . ondansetron (ZOFRAN ODT) 4 MG disintegrating tablet Take 1 tablet (4 mg total) by mouth every 8 (eight) hours as needed for nausea or vomiting. (Patient not taking: Reported on 09/24/2016) 20 tablet 3 Not Taking  . phenylephrine-shark liver oil-mineral oil-petrolatum (PREPARATION H) 0.25-3-14-71.9 % rectal ointment Place 1 application rectally 2 (two) times daily as needed for hemorrhoids. (Patient not taking: Reported on 09/24/2016) 30 g 0 Not Taking  . potassium chloride 20 MEQ TBCR Take 20 mEq by mouth daily. 5 tablet 0 Not Taking  . Prenatal Vit-Fe Fumarate-FA (PRENATAL VITAMINS) 28-0.8 MG TABS Take 1 tablet by mouth daily. 30 tablet 9 Not Taking  . senna-docusate (SENOKOT-S) 8.6-50 MG tablet Take  1 tablet by mouth 2 (two) times daily. (Patient not taking: Reported on 09/24/2016) 60 tablet 1 Not Taking    Review of Systems  Constitutional: Positive for fever.  HENT: Negative for congestion and sore throat.   Eyes: Positive for photophobia.  Respiratory: Positive for cough.   Gastrointestinal: Positive for abdominal pain.  Genitourinary: Negative for dysuria, flank pain, frequency and urgency.  Musculoskeletal: Positive for neck pain.  Skin: Negative for rash.  Neurological: Negative for focal weakness.   Physical Exam   Blood pressure 125/62, pulse 77, temperature 99.3 F (37.4 C), temperature source Oral, resp. rate 16, height 5\' 1"  (1.549 m), weight 93.4 kg (206 lb), last menstrual period  01/30/2016, SpO2 100 %, unknown if currently breastfeeding.  Physical Exam  Constitutional: She is oriented to person, place, and time. She appears well-developed and well-nourished.  Neck: Normal range of motion.  Cardiovascular: Normal rate, regular rhythm and intact distal pulses.   Respiratory: Effort normal and breath sounds normal.  Lymphadenopathy:    She has no cervical adenopathy.  Neurological: She is alert and oriented to person, place, and time.    MAU Course  Procedures  MDM  Not actively in labor, contractions every 10 minutes, cervix is 1 and closed, fetal heart tracing is reassuring.  Headache with fever- will check CBC w/ diff, test for flu, check urinalysis Flexeril given for headache  1445- CBC wnl, U/A without signs of infection. Patient still complaining of headache will repeat migraine cocktail and continue fluids. Flu pcr pending  1600- Flu pcr negative but high false negative rate, patient ready for discharge  Plan of care reviewed with patient, including labs and tests ordered and medical treatment.   Assessment and Plan   Headache: Likely migraine, but also may be due to viral illness.  Will treat for flu with 5 day course of Tamiflu Patient discharged with Fioricet to take as needed Has close follow up on 10/13 with Dr. Harolyn Rutherford Advised to her pick up her prescriptions for iron and potassium and begin those medications, patient has not been taking these Encouraged increasing water intake with goal to urinate every 2 hours clear urine Supplied with bus pass Patient expressed understanding and agreement with plan  Steve Rattler 10/06/2016, 11:09 AM   OB FELLOW MAU DISCHARGE ATTESTATION  I have seen and examined this patient; I agree with above documentation in the resident's note. Patient clinically seems to have influenza, with symptoms of severe headache without relief with meds, low-grade fevers despite tylenol treatment, sore throat, cough,  photophobia. Treating with Tamiflu given symptoms and patient is high-risk being pregnant, and knowing there is a high-false negative Rapid flu swab. Fioricet for migraine with recommendation to eat (has not eaten all day) and to stay hydrated. OK for discharge, BPs stable.    Katherine Basset, DO OB Fellow 11:24 AM

## 2016-10-06 NOTE — ED Notes (Signed)
OB Rapid Response Team at bedside at this time.

## 2016-10-06 NOTE — ED Notes (Signed)
This note also relates to the following rows which could not be included: Pulse Rate - Cannot attach notes to unvalidated device data SpO2 - Cannot attach notes to unvalidated device data  RROB called to assess pregnant pt; pt reports that she is a g6p2; she has had nausea and vomiting over the past two days (has vomited approximately 8 times) and developed a migraine at 1600 on 10/05/16; pt c/o contractions; pt rates the pain in head, abdomen and vagina a 10/10; pt reports positive fetal movement, no vaginal bleeding or leaking of fluid; she reports no problems with this or any of her pregnancies- her twins were born at [place], her other child was born at term; she delivered by c/s each time. Pt reports that she has anemia; some dizziness when she stands up, no visual disturbances, no right upper quadrant pain, no swelling; reflexes are diminished, no clonus; pt reports that she has been able to hold down some food and fluids over the past two days; her last bm was 10/8, no diarrhea or loose stools

## 2016-10-06 NOTE — ED Notes (Signed)
Bed: [id number] Expected date:  Expected time:  Means of arrival:  Comments: Migraine [redacted] wks pregnant

## 2016-10-06 NOTE — Progress Notes (Signed)
Spoke with Dr. Elonda Husky. Pt's uc's have spaced out, but she is still rating her headache a 9 out of 10. Says he is comfortable with pt having another mg of dilaudid, but he will leave it up to the ED MD. FHR tracing remains a category 1.

## 2016-10-06 NOTE — MAU Note (Signed)
Pt stated pain has not improved

## 2016-10-06 NOTE — ED Notes (Signed)
Rapid OB Response Team was notified at this time.

## 2016-10-07 ENCOUNTER — Ambulatory Visit (HOSPITAL_COMMUNITY): Payer: Medicaid Other

## 2016-10-08 ENCOUNTER — Inpatient Hospital Stay (HOSPITAL_COMMUNITY): Payer: Medicaid Other

## 2016-10-08 ENCOUNTER — Inpatient Hospital Stay (HOSPITAL_COMMUNITY)
Admission: AD | Admit: 2016-10-08 | Discharge: 2016-10-08 | Disposition: A | Discharge status: Home / Self Care | Payer: Medicaid Other | Source: Ambulatory Visit | Attending: Family Medicine | Admitting: Family Medicine

## 2016-10-08 ENCOUNTER — Telehealth: Payer: Self-pay | Admitting: *Deleted

## 2016-10-08 ENCOUNTER — Encounter (HOSPITAL_COMMUNITY): Payer: Self-pay

## 2016-10-08 DIAGNOSIS — O4703 False labor before 37 completed weeks of gestation, third trimester: Secondary | ICD-10-CM | POA: Diagnosis not present

## 2016-10-08 DIAGNOSIS — Z3A35 35 weeks gestation of pregnancy: Secondary | ICD-10-CM | POA: Insufficient documentation

## 2016-10-08 DIAGNOSIS — Z87891 Personal history of nicotine dependence: Secondary | ICD-10-CM | POA: Diagnosis not present

## 2016-10-08 DIAGNOSIS — O34219 Maternal care for unspecified type scar from previous cesarean delivery: Secondary | ICD-10-CM

## 2016-10-08 DIAGNOSIS — O479 False labor, unspecified: Secondary | ICD-10-CM

## 2016-10-08 DIAGNOSIS — O99210 Obesity complicating pregnancy, unspecified trimester: Secondary | ICD-10-CM

## 2016-10-08 DIAGNOSIS — O288 Other abnormal findings on antenatal screening of mother: Secondary | ICD-10-CM

## 2016-10-08 DIAGNOSIS — O09219 Supervision of pregnancy with history of pre-term labor, unspecified trimester: Secondary | ICD-10-CM

## 2016-10-08 DIAGNOSIS — O4700 False labor before 37 completed weeks of gestation, unspecified trimester: Secondary | ICD-10-CM

## 2016-10-08 LAB — URINALYSIS, ROUTINE W REFLEX MICROSCOPIC
Bilirubin Urine: NEGATIVE
Glucose, UA: NEGATIVE mg/dL
Hgb urine dipstick: NEGATIVE
KETONES UR: NEGATIVE mg/dL
LEUKOCYTES UA: NEGATIVE
NITRITE: NEGATIVE
PH: 6 (ref 5.0–8.0)
Protein, ur: NEGATIVE mg/dL

## 2016-10-08 MED ORDER — LACTATED RINGERS IV BOLUS (SEPSIS)
1000.0000 mL | Freq: Once | INTRAVENOUS | Status: AC
Start: 2016-10-08 — End: 2016-10-08
  Administered 2016-10-08: 1000 mL via INTRAVENOUS

## 2016-10-08 NOTE — MAU Note (Signed)
Has been having contractions since yesterday, and have been getting worse. Feels like they are 4-5 minutes apart. Still having headache, but not as bad as the other day when she was here.  Vomited before she called ems and had a small nosebleed.

## 2016-10-08 NOTE — MAU Provider Note (Signed)
History     CSN: [id number]  Arrival date and time: 10/08/16 1121    Chief Complaint  Patient presents with  . Contractions   HPI  Patient is a 20 year old G5 P3 at 35 weeks and 0 days who presents today with some contractions and abdominal pain. She reports regular fetal movement no vaginal bleeding or leakage of fluid. She reports occasional contraction pain that is crampy in nature. She was seen here several days ago for flulike symptoms however her flu swab came back negative. She reports those symptoms of fatigue headache and malaise have mostly resolved at this time.  OB History    Gravida Para Term Preterm AB Living   5 2 1 1 2 3    SAB TAB Ectopic Multiple Live Births   2     1 3       Past Medical History:  Diagnosis Date  . Anemia   . Fibroids   . Migraine     Past Surgical History:  Procedure Laterality Date  . CESAREAN SECTION  2013  . NOSE SURGERY  2015    Family History  Problem Relation Age of Onset  . Anemia Mother   . Fibroids Mother     Social History  Substance Use Topics  . Smoking status: Former Smoker    Types: Cigarettes    Quit date: 10/20/2014  . Smokeless tobacco: Never Used  . Alcohol use No    Allergies:  Allergies  Allergen Reactions  . 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 Onion Anaphylaxis  . Other Anaphylaxis, Hives, Itching and Other (See Comments)    Pt states that she is allergic to fire ants.    [place] Honey Bee Venom [Honey Bee Venom] Anaphylaxis  . [name] Isothiocyanate] Hives    Prescriptions Prior to Admission  Medication Sig Dispense Refill Last Dose  . calcium carbonate (TUMS - DOSED IN MG ELEMENTAL CALCIUM) 500 MG chewable tablet Chew 1 tablet by mouth as needed for indigestion or heartburn.   Past Week at Unknown time  . Prenatal MV-Min-FA-Omega-3 (PRENATAL GUMMIES/DHA & FA) 0.4-32.5 MG CHEW Chew 1 each by mouth 2 (two) times daily.   10/07/2016 at Unknown time  . butalbital-acetaminophen-caffeine  (FIORICET, ESGIC) 50-325-40 MG tablet Take 1-2 tablets by mouth every 6 (six) hours as needed for headache. (Patient not taking: Reported on 10/08/2016) 20 tablet 0 Not Taking at Unknown time  . ferrous sulfate 325 (65 FE) MG tablet Take 1 tablet (325 mg total) by mouth 3 (three) times daily with meals. (Patient not taking: Reported on 10/08/2016) 90 tablet 3 Not Taking at Unknown time  . ondansetron (ZOFRAN ODT) 4 MG disintegrating tablet Take 1 tablet (4 mg total) by mouth every 8 (eight) hours as needed for nausea or vomiting. (Patient not taking: Reported on 09/24/2016) 20 tablet 3 Not Taking  . oseltamivir (TAMIFLU) 75 MG capsule Take 1 capsule (75 mg total) by mouth 2 (two) times daily. (Patient not taking: Reported on 10/08/2016) 10 capsule 0 Not Taking at Unknown time  . potassium chloride 20 MEQ TBCR Take 20 mEq by mouth daily. (Patient not taking: Reported on 10/08/2016) 5 tablet 0 Not Taking at Unknown time    Review of Systems  Constitutional: Negative for chills and fever.  HENT: Negative for congestion.   Eyes: Negative for blurred vision and double vision.  Respiratory: Negative for cough, hemoptysis and sputum production.   Cardiovascular: Negative for chest pain and palpitations.  Gastrointestinal: Positive for abdominal pain. Negative  for heartburn, nausea and vomiting.  Genitourinary: Negative for dysuria and urgency.  Musculoskeletal: Negative for back pain, myalgias and neck pain.  Skin: Negative for itching and rash.  Neurological: Negative for dizziness, tingling, tremors and headaches.  Endo/Heme/Allergies: Negative for environmental allergies. Does not bruise/bleed easily.   Physical Exam   Blood pressure 125/67, pulse 95, temperature 98.7 F (37.1 C), temperature source Oral, resp. rate 18, last menstrual period 01/30/2016, SpO2 99 %, unknown if currently breastfeeding.  Physical Exam  Constitutional: She is oriented to person, place, and time. She appears  well-developed and well-nourished.  Cardiovascular: Normal rate and intact distal pulses.   Respiratory: Effort normal and breath sounds normal. No respiratory distress.  GI: Soft. Bowel sounds are normal. She exhibits no distension. There is no tenderness. There is no rebound.  Genitourinary:  Genitourinary Comments: Cervix closed thick and high  Neurological: She is alert and oriented to person, place, and time. No cranial nerve deficit.  Skin: Skin is warm and dry.  Psychiatric: She has a normal mood and affect.    MAU Course  Procedures  MDM In the MAU patient was initially placed on the fetal monitor. She had a fetal heart tracing with good variability and some 15 x 15 accelerations the beginning however she did have 2 variable decelerations which were noted. At that time patient was given 1 L IV fluid bolus and sent to MFM for a BPP. BPP returned 8 out of 8. When patient returned to the unit she was placed back on the monitor and had a reactive NST. She had no further decelerations. Patient was evaluated and reported her pain had improved minimally. Contractions were no longer significant on the monitor. Patient was at that time agreeable to discharge home.  Assessment and Plan  Assessment and plan: #1: Nonreassuring fetal heart tracing resolved with IV fluid bolus. BPP 8 out of 8 no further intervention needed #2: Preterm contractions: Resolved with IV fluid bolus.  Jacquiline Doe 10/08/2016, 2:20 PM

## 2016-10-08 NOTE — Progress Notes (Signed)
CSW was called by RN to meet with patient.  Patient has been routinely calling EMS to transport patient to MAU.  Patient has not been consistent with [facility].  CSW met with MOB to assess MOB's barriers, concerns, and to offer resources and support. Patient was inviting and pleasant with CSW.  CSW inquired about patient's limited [facility] and patient communicated that patient does not have a car and has experienced transportation barriers.  CSW explored  transportation optionswith patient.  CSW encouraged patient to contact Medicaid transportation to assist with patient's upcoming prenatal appointment.  CSW provided patient with contact number and encouraged patient to call today to secure transportation.  Patient agreed to contact Medicaid Transportation for patient scheduled appointment on 10/10/16 at the [facility].  CSW inquired about patient's housing and patient reported having stable housing at this time.  CSW informed patient that if patient continues to have limited [facility], CSW will visit with patient while inpatient.  Patient was understanding and had no other question or concerns at this time.

## 2016-10-08 NOTE — Telephone Encounter (Signed)
Pt left message on 10/10 requesting her test results from 10/9.

## 2016-10-08 NOTE — Discharge Instructions (Signed)
Braxton Hicks Contractions °Contractions of the uterus can occur throughout pregnancy. Contractions are not always a sign that you are in labor.  °WHAT ARE BRAXTON HICKS CONTRACTIONS?  °Contractions that occur before labor are called Braxton Hicks contractions, or false labor. Toward the end of pregnancy (32-34 weeks), these contractions can develop more often and may become more forceful. This is not true labor because these contractions do not result in opening (dilatation) and thinning of the cervix. They are sometimes difficult to tell apart from true labor because these contractions can be forceful and people have different pain tolerances. You should not feel embarrassed if you go to the hospital with false labor. Sometimes, the only way to tell if you are in true labor is for your health care provider to look for changes in the cervix. °If there are no prenatal problems or other health problems associated with the pregnancy, it is completely safe to be sent home with false labor and await the onset of true labor. °HOW CAN YOU TELL THE DIFFERENCE BETWEEN TRUE AND FALSE LABOR? °False Labor °· The contractions of false labor are usually shorter and not as hard as those of true labor.   °· The contractions are usually irregular.   °· The contractions are often felt in the front of the lower abdomen and in the groin.   °· The contractions may go away when you walk around or change positions while lying down.   °· The contractions get weaker and are shorter lasting as time goes on.   °· The contractions do not usually become progressively stronger, regular, and closer together as with true labor.   °True Labor °· Contractions in true labor last 30-70 seconds, become very regular, usually become more intense, and increase in frequency.   °· The contractions do not go away with walking.   °· The discomfort is usually felt in the top of the uterus and spreads to the lower abdomen and low back.   °· True labor can be  determined by your health care provider with an exam. This will show that the cervix is dilating and getting thinner.   °WHAT TO REMEMBER °· Keep up with your usual exercises and follow other instructions given by your health care provider.   °· Take medicines as directed by your health care provider.   °· Keep your regular prenatal appointments.   °· Eat and drink lightly if you think you are going into labor.   °· If Braxton Hicks contractions are making you uncomfortable:   °¨ Change your position from lying down or resting to walking, or from walking to resting.   °¨ Sit and rest in a tub of warm water.   °¨ Drink 2-3 glasses of water. Dehydration may cause these contractions.   °¨ Do slow and deep breathing several times an hour.   °WHEN SHOULD I SEEK IMMEDIATE MEDICAL CARE? °Seek immediate medical care if: °· Your contractions become stronger, more regular, and closer together.   °· You have fluid leaking or gushing from your vagina.   °· You have a fever.   °· You pass blood-tinged mucus.   °· You have vaginal bleeding.   °· You have continuous abdominal pain.   °· You have low back pain that you never had before.   °· You feel your baby's head pushing down and causing pelvic pressure.   °· Your baby is not moving as much as it used to.   °  °This information is not intended to replace advice given to you by your health care provider. Make sure you discuss any questions you have with your health care   provider.   Document Released: 12/15/2005 Document Revised: 12/20/2013 Document Reviewed: 09/26/2013 Elsevier Interactive Patient Education 2016 [place] of Pregnancy The third trimester is from week 29 through week 42, months 7 through 9. The third trimester is a time when the fetus is growing rapidly. At the end of the ninth month, the fetus is about 20 inches in length and weighs 6-10 pounds.  BODY CHANGES Your body goes through many changes during pregnancy. The changes vary  from woman to woman.   Your weight will continue to increase. You can expect to gain 25-35 pounds (11-16 kg) by the end of the pregnancy.  You may begin to get stretch marks on your hips, abdomen, and breasts.  You may urinate more often because the fetus is moving lower into your pelvis and pressing on your bladder.  You may develop or continue to have heartburn as a result of your pregnancy.  You may develop constipation because certain hormones are causing the muscles that push waste through your intestines to slow down.  You may develop hemorrhoids or swollen, bulging veins (varicose veins).  You may have pelvic pain because of the weight gain and pregnancy hormones relaxing your joints between the bones in your pelvis. Backaches may result from overexertion of the muscles supporting your posture.  You may have changes in your hair. These can include thickening of your hair, rapid growth, and changes in texture. Some women also have hair loss during or after pregnancy, or hair that feels dry or thin. Your hair will most likely return to normal after your baby is born.  Your breasts will continue to grow and be tender. A yellow discharge may leak from your breasts called colostrum.  Your belly button may stick out.  You may feel short of breath because of your expanding uterus.  You may notice the fetus "dropping," or moving lower in your abdomen.  You may have a bloody mucus discharge. This usually occurs a few days to a week before labor begins.  Your cervix becomes thin and soft (effaced) near your due date. WHAT TO EXPECT AT YOUR PRENATAL EXAMS  You will have prenatal exams every 2 weeks until week 36. Then, you will have weekly prenatal exams. During a routine prenatal visit:  You will be weighed to make sure you and the fetus are growing normally.  Your blood pressure is taken.  Your abdomen will be measured to track your baby's growth.  The fetal heartbeat will be  listened to.  Any test results from the previous visit will be discussed.  You may have a cervical check near your due date to see if you have effaced. At around 36 weeks, your caregiver will check your cervix. At the same time, your caregiver will also perform a test on the secretions of the vaginal tissue. This test is to determine if a type of bacteria, Group B streptococcus, is present. Your caregiver will explain this further. Your caregiver may ask you:  What your birth plan is.  How you are feeling.  If you are feeling the baby move.  If you have had any abnormal symptoms, such as leaking fluid, bleeding, severe headaches, or abdominal cramping.  If you are using any tobacco products, including cigarettes, chewing tobacco, and electronic cigarettes.  If you have any questions. Other tests or screenings that may be performed during your third trimester include:  Blood tests that check for low iron levels (anemia).  Fetal  testing to check the health, activity level, and growth of the fetus. Testing is done if you have certain medical conditions or if there are problems during the pregnancy.  HIV (human immunodeficiency virus) testing. If you are at high risk, you may be screened for HIV during your third trimester of pregnancy. FALSE LABOR You may feel small, irregular contractions that eventually go away. These are called Braxton Hicks contractions, or false labor. Contractions may last for hours, days, or even weeks before true labor sets in. If contractions come at regular intervals, intensify, or become painful, it is best to be seen by your caregiver.  SIGNS OF LABOR   Menstrual-like cramps.  Contractions that are 5 minutes apart or less.  Contractions that start on the top of the uterus and spread down to the lower abdomen and back.  A sense of increased pelvic pressure or back pain.  A watery or bloody mucus discharge that comes from the vagina. If you have any of  these signs before the 37th week of pregnancy, call your caregiver right away. You need to go to the hospital to get checked immediately. HOME CARE INSTRUCTIONS   Avoid all smoking, herbs, alcohol, and unprescribed drugs. These chemicals affect the formation and growth of the baby.  Do not use any tobacco products, including cigarettes, chewing tobacco, and electronic cigarettes. If you need help quitting, ask your health care provider. You may receive counseling support and other resources to help you quit.  Follow your caregiver's instructions regarding medicine use. There are medicines that are either safe or unsafe to take during pregnancy.  Exercise only as directed by your caregiver. Experiencing uterine cramps is a good sign to stop exercising.  Continue to eat regular, healthy meals.  Wear a good support bra for breast tenderness.  Do not use hot tubs, steam rooms, or saunas.  Wear your seat belt at all times when driving.  Avoid raw meat, uncooked cheese, cat litter boxes, and soil used by cats. These carry germs that can cause birth defects in the baby.  Take your prenatal vitamins.  Take 1500-2000 mg of calcium daily starting at the 20th week of pregnancy until you deliver your baby.  Try taking a stool softener (if your caregiver approves) if you develop constipation. Eat more high-fiber foods, such as fresh vegetables or fruit and whole grains. Drink plenty of fluids to keep your urine clear or pale yellow.  Take warm sitz baths to soothe any pain or discomfort caused by hemorrhoids. Use hemorrhoid cream if your caregiver approves.  If you develop varicose veins, wear support hose. Elevate your feet for 15 minutes, 3-4 times a day. Limit salt in your diet.  Avoid heavy lifting, wear low heal shoes, and practice good posture.  Rest a lot with your legs elevated if you have leg cramps or low back pain.  Visit your dentist if you have not gone during your pregnancy. Use a  soft toothbrush to brush your teeth and be gentle when you floss.  A sexual relationship may be continued unless your caregiver directs you otherwise.  Do not travel far distances unless it is absolutely necessary and only with the approval of your caregiver.  Take prenatal classes to understand, practice, and ask questions about the labor and delivery.  Make a trial run to the hospital.  Pack your hospital bag.  Prepare the baby's nursery.  Continue to go to all your prenatal visits as directed by your caregiver. [place]  IF:  You are unsure if you are in labor or if your water has broken.  You have dizziness.  You have mild pelvic cramps, pelvic pressure, or nagging pain in your abdominal area.  You have persistent nausea, vomiting, or diarrhea.  You have a bad smelling vaginal discharge.  You have pain with urination. SEEK IMMEDIATE MEDICAL CARE IF:   You have a fever.  You are leaking fluid from your vagina.  You have spotting or bleeding from your vagina.  You have severe abdominal cramping or pain.  You have rapid weight loss or gain.  You have shortness of breath with chest pain.  You notice sudden or extreme swelling of your face, hands, ankles, feet, or legs.  You have not felt your baby move in over an hour.  You have severe headaches that do not go away with medicine.  You have vision changes.   This information is not intended to replace advice given to you by your health care provider. Make sure you discuss any questions you have with your health care provider.   Document Released: 12/09/2001 Document Revised: 01/05/2015 Document Reviewed: 02/15/2013 Elsevier Interactive Patient Education [employer].

## 2016-10-08 NOTE — MAU Note (Signed)
Provider informed me that patient stated she wanted them to do a c-section on her today after they told her they would be discharging her. Pt stated she wanted a bus pass, as she was given one on Monday. Pt told the provider that she would come back every day if that's what it took to get them to do the c-section. I called social work to come speak to the patient about this. They informed me they will be here to speak with the patient in about 30 minutes.

## 2016-10-08 NOTE — MAU Note (Signed)
Urine in lab 

## 2016-10-09 ENCOUNTER — Encounter: Payer: Self-pay | Admitting: General Practice

## 2016-10-10 ENCOUNTER — Other Ambulatory Visit: Payer: Self-pay | Admitting: Family Medicine

## 2016-10-10 ENCOUNTER — Ambulatory Visit (INDEPENDENT_AMBULATORY_CARE_PROVIDER_SITE_OTHER): Payer: Medicaid Other | Admitting: Obstetrics & Gynecology

## 2016-10-10 ENCOUNTER — Other Ambulatory Visit (HOSPITAL_COMMUNITY)
Admission: RE | Admit: 2016-10-10 | Discharge: 2016-10-10 | Disposition: A | Discharge status: Home / Self Care | Payer: Medicaid Other | Source: Ambulatory Visit | Attending: Obstetrics & Gynecology | Admitting: Obstetrics & Gynecology

## 2016-10-10 ENCOUNTER — Ambulatory Visit (HOSPITAL_COMMUNITY)
Admission: RE | Admit: 2016-10-10 | Discharge: 2016-10-10 | Disposition: A | Discharge status: Home / Self Care | Payer: Medicaid Other | Source: Ambulatory Visit | Attending: Family Medicine | Admitting: Family Medicine

## 2016-10-10 ENCOUNTER — Encounter (HOSPITAL_COMMUNITY): Payer: Self-pay | Admitting: *Deleted

## 2016-10-10 VITALS — BP 103/56 | HR 87 | Temp 98.3°F | Wt 216.6 lb

## 2016-10-10 DIAGNOSIS — Z113 Encounter for screening for infections with a predominantly sexual mode of transmission: Secondary | ICD-10-CM | POA: Diagnosis not present

## 2016-10-10 DIAGNOSIS — O4703 False labor before 37 completed weeks of gestation, third trimester: Secondary | ICD-10-CM | POA: Diagnosis present

## 2016-10-10 DIAGNOSIS — Z3491 Encounter for supervision of normal pregnancy, unspecified, first trimester: Secondary | ICD-10-CM

## 2016-10-10 DIAGNOSIS — O34219 Maternal care for unspecified type scar from previous cesarean delivery: Secondary | ICD-10-CM

## 2016-10-10 DIAGNOSIS — Z3A35 35 weeks gestation of pregnancy: Secondary | ICD-10-CM | POA: Insufficient documentation

## 2016-10-10 DIAGNOSIS — O09213 Supervision of pregnancy with history of pre-term labor, third trimester: Secondary | ICD-10-CM | POA: Insufficient documentation

## 2016-10-10 DIAGNOSIS — O09219 Supervision of pregnancy with history of pre-term labor, unspecified trimester: Secondary | ICD-10-CM

## 2016-10-10 DIAGNOSIS — O09899 Supervision of other high risk pregnancies, unspecified trimester: Secondary | ICD-10-CM

## 2016-10-10 DIAGNOSIS — Z3483 Encounter for supervision of other normal pregnancy, third trimester: Secondary | ICD-10-CM

## 2016-10-10 LAB — OB RESULTS CONSOLE GC/CHLAMYDIA: Gonorrhea: NEGATIVE

## 2016-10-10 LAB — OB RESULTS CONSOLE GBS: GBS: POSITIVE

## 2016-10-10 NOTE — Patient Instructions (Signed)
Return to clinic for any scheduled appointments or obstetric concerns, or go to MAU for evaluation  

## 2016-10-10 NOTE — Progress Notes (Signed)
   PRENATAL VISIT NOTE  Subjective:  Amber Howard is a 20 y.o. [phone] at [redacted]w[redacted]d being seen today for ongoing prenatal care.  She is currently monitored for the following issues for this low-risk pregnancy and has Previous cesarean delivery x 2 affecting pregnancy, antepartum; Supervision of normal pregnancy in third trimester; Previous preterm delivery of twins due to IUGR, antepartum; and Obesity in pregnancy, antepartum on her problem list.  Patient reports occasional contractions.  Contractions: Irritability. Vag. Bleeding: None.  Movement: Present. Denies leaking of fluid.   The following portions of the patient's history were reviewed and updated as appropriate: allergies, current medications, past family history, past medical history, past social history, past surgical history and problem list. Problem list updated.  Objective:   Vitals:   10/10/16 1138  BP: (!) 103/56  Pulse: 87  Temp: 98.3 F (36.8 C)  Weight: 216 lb 9.6 oz (98.2 kg)    Fetal Status: Fetal Heart Rate (bpm): 156 Fundal Height: 37 cm Movement: Present  Presentation: Vertex  General:  Alert, oriented and cooperative. Patient is in no acute distress.  Skin: Skin is warm and dry. No rash noted.   Cardiovascular: Normal heart rate noted  Respiratory: Normal respiratory effort, no problems with respiration noted  Abdomen: Soft, gravid, appropriate for gestational age. Pain/Pressure: Present     Pelvic:  Cervical exam performed Dilation: 1 Effacement (%): Thick Station: Ballotable  Extremities: Normal range of motion.  Edema: Trace  Mental Status: Normal mood and affect. Normal behavior. Normal judgment and thought content.   Urinalysis:      Assessment and Plan:  Pregnancy: [id number] at [redacted]w[redacted]d  1. Previous cesarean delivery x 2 affecting pregnancy, antepartum Surgical order sent in to schedule at 39 weeks, declined BTS.  Considering OCPs,  2. Encounter for supervision of other normal pregnancy in third  trimester Pelvic cultures today - Culture, beta strep (group b only) - GC/Chlamydia probe amp ([facility])not at [facility] Preterm labor symptoms and general obstetric precautions including but not limited to vaginal bleeding, contractions, leaking of fluid and fetal movement were reviewed in detail with the patient. Please refer to After Visit Summary for other counseling recommendations.  Return in about 1 week (around 10/17/2016) for OB Visit.  Osborne Oman, MD

## 2016-10-11 LAB — CULTURE, BETA STREP (GROUP B ONLY)

## 2016-10-13 ENCOUNTER — Encounter: Payer: Self-pay | Admitting: Obstetrics & Gynecology

## 2016-10-13 DIAGNOSIS — O99820 Streptococcus B carrier state complicating pregnancy: Secondary | ICD-10-CM | POA: Insufficient documentation

## 2016-10-13 LAB — GC/CHLAMYDIA PROBE AMP (~~LOC~~) NOT AT ARMC
CHLAMYDIA, DNA PROBE: NEGATIVE
Neisseria Gonorrhea: NEGATIVE

## 2016-10-15 ENCOUNTER — Encounter: Payer: Self-pay | Admitting: *Deleted

## 2016-10-15 ENCOUNTER — Ambulatory Visit (INDEPENDENT_AMBULATORY_CARE_PROVIDER_SITE_OTHER): Payer: Medicaid Other | Admitting: Obstetrics and Gynecology

## 2016-10-15 VITALS — BP 111/55 | HR 88 | Wt 212.3 lb

## 2016-10-15 DIAGNOSIS — Z3493 Encounter for supervision of normal pregnancy, unspecified, third trimester: Secondary | ICD-10-CM

## 2016-10-15 DIAGNOSIS — O99820 Streptococcus B carrier state complicating pregnancy: Secondary | ICD-10-CM

## 2016-10-15 DIAGNOSIS — O34219 Maternal care for unspecified type scar from previous cesarean delivery: Secondary | ICD-10-CM

## 2016-10-15 MED ORDER — CYCLOBENZAPRINE HCL 10 MG PO TABS: 10.0000 mg | ORAL_TABLET | Freq: Three times a day (TID) | ORAL | 0 refills | Status: DC | PRN

## 2016-10-15 MED ORDER — PROMETHAZINE HCL 25 MG PO TABS: 25.0000 mg | ORAL_TABLET | Freq: Four times a day (QID) | ORAL | 0 refills | Status: DC | PRN

## 2016-10-15 NOTE — Progress Notes (Signed)
Pt c/o headache, vomitting, dizziness, diarrhea, loss of appetite, and nosebleeds Cultures today

## 2016-10-15 NOTE — Progress Notes (Signed)
   PRENATAL VISIT NOTE  Subjective:  Amber Howard is a 20 y.o. [phone] at [redacted]w[redacted]d being seen today for ongoing prenatal care.  She is currently monitored for the following issues for this low-risk pregnancy and has Previous cesarean delivery x 2 affecting pregnancy, antepartum; Supervision of normal pregnancy in third trimester; Previous preterm delivery of twins due to IUGR, antepartum; Obesity in pregnancy, antepartum; and Group B Streptococcus carrier, +RV culture, currently pregnant on her problem list.  Patient reports fatigue, headache and nausea.  Contractions: Irregular. Vag. Bleeding: None.  Movement: Absent. Denies leaking of fluid. Patient not feeling well would like to be delivered as soon as possible. She has had nausea and HA. She is interested in taking medication for the symptoms. These symptoms are not new. She denies changes in her vision.   The following portions of the patient's history were reviewed and updated as appropriate: allergies, current medications, past family history, past medical history, past social history, past surgical history and problem list. Problem list updated.  Objective:   Vitals:   10/15/16 1120  BP: (!) 111/55  Pulse: 88  Weight: 212 lb 4.8 oz (96.3 kg)    Fetal Status: Fetal Heart Rate (bpm): 145   Movement: Absent     General:  Alert, oriented and cooperative. Patient is in no acute distress.  Skin: Skin is warm and dry. No rash noted.   Cardiovascular: Normal heart rate noted  Respiratory: Normal respiratory effort, no problems with respiration noted  Abdomen: Soft, gravid, appropriate for gestational age. Pain/Pressure: Present     Pelvic:  Cervix: 1 cm, thick and posterior   Extremities: Normal range of motion.  Edema: Trace  Mental Status: Normal mood and affect. Normal behavior. Normal judgment and thought content.   Assessment and Plan:  Pregnancy: [id number] at [redacted]w[redacted]d  1. Encounter for supervision of normal pregnancy in third  trimester, unspecified gravidity - RX for phenergan and flexeril - Discussed reasons to go to MAU  2. Previous cesarean delivery x 2 affecting pregnancy, antepartum - Repeat Cesarean section scheduled for November 8th  3. Group B Streptococcus carrier, +RV culture, currently pregnant -Discussed with the patient, will treat during labor.     Preterm labor symptoms and general obstetric precautions including but not limited to vaginal bleeding, contractions, leaking of fluid and fetal movement were reviewed in detail with the patient. Please refer to After Visit Summary for other counseling recommendations.  Return in about 1 week (around 10/22/2016).  Lezlie Lye, NP

## 2016-10-17 ENCOUNTER — Observation Stay
Admission: EM | Admit: 2016-10-17 | Discharge: 2016-10-17 | Disposition: A | Discharge status: Home / Self Care | Payer: Medicaid Other | Attending: Obstetrics and Gynecology | Admitting: Obstetrics and Gynecology

## 2016-10-17 DIAGNOSIS — O09219 Supervision of pregnancy with history of pre-term labor, unspecified trimester: Secondary | ICD-10-CM

## 2016-10-17 DIAGNOSIS — Z3493 Encounter for supervision of normal pregnancy, unspecified, third trimester: Principal | ICD-10-CM | POA: Insufficient documentation

## 2016-10-17 DIAGNOSIS — O34219 Maternal care for unspecified type scar from previous cesarean delivery: Secondary | ICD-10-CM

## 2016-10-17 DIAGNOSIS — Z3A39 39 weeks gestation of pregnancy: Secondary | ICD-10-CM | POA: Insufficient documentation

## 2016-10-17 DIAGNOSIS — O99210 Obesity complicating pregnancy, unspecified trimester: Secondary | ICD-10-CM

## 2016-10-17 MED ORDER — ACETAMINOPHEN 325 MG PO TABS: 650.0000 mg | ORAL_TABLET | ORAL | Status: DC | PRN

## 2016-10-17 MED ORDER — LACTATED RINGERS IV SOLN: 500.0000 mL | INTRAVENOUS | Status: DC | PRN

## 2016-10-17 MED ORDER — OXYCODONE-ACETAMINOPHEN 5-325 MG PO TABS: 1.0000 | ORAL_TABLET | ORAL | Status: DC | PRN

## 2016-10-17 NOTE — OB Triage Note (Signed)
Amber Howard here with c/o LOF which occurred at [place] and 1030 today, denies bleeding, reports ctx but has not timed them. Doesn't remember last fetal movement.

## 2016-10-17 NOTE — OB Triage Provider Note (Signed)
S: Leaking fluid and thought my water broke. OB patient at [facility] health and planning 39 week RCS. LMP was 01/30/16 with EDD of 11/12/16 and attends [employer] [employer] in [place], [place]. Past Medical History:  Diagnosis Date  . Anemia   . Fibroids   . Migraine    Past Surgical History:  Procedure Laterality Date  . CESAREAN SECTION  2013  . NOSE SURGERY  2015   Family History  Problem Relation Age of Onset  . Anemia Mother   . Fibroids Mother    Social History   Social History  . Marital status: Single    Spouse name: N/A  . Number of children: N/A  . Years of education: N/A   Occupational History  . Not on file.   Social History Main Topics  . Smoking status: Former Smoker    Packs/day: 0.25    Types: Cigarettes    Quit date: 10/20/2014  . Smokeless tobacco: Never Used  . Alcohol use No  . Drug use: No  . Sexual activity: Yes    Birth control/ protection: None   Other Topics Concern  . Not on file   Social History Narrative  . No narrative on file   Allergies  Allergen Reactions  . 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 Onion Anaphylaxis  . Other Anaphylaxis, Hives, Itching and Other (See Comments)    Pt states that she is allergic to fire ants.    Horris [place] Honey Bee Venom [Honey Bee Venom] Anaphylaxis  . [name] Isothiocyanate] Hives   OB History    Gravida Para Term Preterm AB Living   5 2 1 1 2 3    SAB TAB Ectopic Multiple Live Births   2     1 3     Meds: PNV's O: Gen: 20 yo female in NAD. Report from [place], [place] and 1 variable to 100 noted x 10 sec with return to baseline. NST reactive with 2 accels 15 x 15 BPM noted. Neg Nitrazine per RN. No fluid noted on the perineum. A: 1. IUP at 39 weeks 2. NST reactive P: DC home after 1 hour of monitoring if FHR stable.

## 2016-10-17 NOTE — Discharge Instructions (Signed)
°  Document Released: 04/11/2011 Document Revised: 03/08/2012 Document Reviewed: 06/18/2015 Elsevier Interactive Patient Education [employer].

## 2016-10-17 NOTE — Discharge Summary (Signed)
Obstetric Discharge Summary   Patient ID: Amber Howard MRN: [id number] DOB/AGE: 22/22/1997 22 y.o.   Date of Admission: 10/17/2016  Date of Discharge: 10/17/16  Admitting Diagnosis:  iUP at 39 weeks with LOF ruled out for ROM Secondary Diagnosis: ?ROM ruled out  Mode of Delivery: not delivered yet     Discharge Diagnosis: IUP at term with ruptured membranes ruled out   Intrapartum Procedures:Not yet   Post partum procedures: Not yet  Complications: None   [place] is a [id number] who had LOF today and was seen for ROM which was ruled out. She is  voiding without difficulty and tolerating regular diet.  She was deemed stable for discharge to home.    Labs: CBC Latest Ref Rng & Units 10/06/2016 10/06/2016 09/24/2016  WBC 4.0 - 10.5 K/uL 8.1 - 5.8  Hemoglobin 12.0 - 15.0 g/dL 7.4(L) 8.2(L) 8.1(L)  Hematocrit 36.0 - 46.0 % 22.8(L) 24.0(L) 25.4(L)  Platelets 150 - 400 K/uL 215 - 239    Physical exam:  Blood pressure 122/75, pulse 87, temperature 98.5 F (36.9 C), temperature source Oral, resp. rate 16, height 5\' 1"  (1.549 m), weight 214 lb (97.1 kg), last menstrual period 01/30/2016, unknown if currently breastfeeding.   Discharge Instructions: Per After Visit Summary. Activity: Advance as tolerated. Pelvic rest for 6 weeks.  Also refer to After Visit Summary Diet: Regular  Outpatient follow up:  Postpartum contraception: after delivery to be determined  Discharged Condition:stable   Discharged to: home   Catheryn Bacon, [place] 10/17/2016

## 2016-10-22 ENCOUNTER — Encounter (HOSPITAL_COMMUNITY): Payer: Self-pay | Admitting: *Deleted

## 2016-10-22 ENCOUNTER — Inpatient Hospital Stay (HOSPITAL_COMMUNITY)
Admission: AD | Admit: 2016-10-22 | Discharge: 2016-10-22 | Disposition: A | Discharge status: Home / Self Care | Payer: Medicaid Other | Source: Ambulatory Visit | Attending: Family Medicine | Admitting: Family Medicine

## 2016-10-22 ENCOUNTER — Ambulatory Visit (INDEPENDENT_AMBULATORY_CARE_PROVIDER_SITE_OTHER): Payer: Medicaid Other | Admitting: Advanced Practice Midwife

## 2016-10-22 ENCOUNTER — Encounter (HOSPITAL_COMMUNITY): Payer: Self-pay

## 2016-10-22 VITALS — BP 122/69 | HR 90 | Wt 213.2 lb

## 2016-10-22 DIAGNOSIS — O99210 Obesity complicating pregnancy, unspecified trimester: Secondary | ICD-10-CM

## 2016-10-22 DIAGNOSIS — O471 False labor at or after 37 completed weeks of gestation: Secondary | ICD-10-CM

## 2016-10-22 DIAGNOSIS — F17210 Nicotine dependence, cigarettes, uncomplicated: Secondary | ICD-10-CM | POA: Diagnosis not present

## 2016-10-22 DIAGNOSIS — O368130 Decreased fetal movements, third trimester, not applicable or unspecified: Secondary | ICD-10-CM | POA: Diagnosis present

## 2016-10-22 DIAGNOSIS — O99333 Smoking (tobacco) complicating pregnancy, third trimester: Secondary | ICD-10-CM | POA: Insufficient documentation

## 2016-10-22 DIAGNOSIS — O99213 Obesity complicating pregnancy, third trimester: Secondary | ICD-10-CM | POA: Diagnosis not present

## 2016-10-22 DIAGNOSIS — O34219 Maternal care for unspecified type scar from previous cesarean delivery: Secondary | ICD-10-CM | POA: Insufficient documentation

## 2016-10-22 DIAGNOSIS — O479 False labor, unspecified: Secondary | ICD-10-CM

## 2016-10-22 DIAGNOSIS — O09219 Supervision of pregnancy with history of pre-term labor, unspecified trimester: Secondary | ICD-10-CM

## 2016-10-22 DIAGNOSIS — Z3A37 37 weeks gestation of pregnancy: Secondary | ICD-10-CM

## 2016-10-22 DIAGNOSIS — Z3493 Encounter for supervision of normal pregnancy, unspecified, third trimester: Secondary | ICD-10-CM

## 2016-10-22 DIAGNOSIS — O4703 False labor before 37 completed weeks of gestation, third trimester: Secondary | ICD-10-CM | POA: Diagnosis not present

## 2016-10-22 MED ORDER — BUTORPHANOL TARTRATE 1 MG/ML IJ SOLN
1.0000 mg | Freq: Once | INTRAMUSCULAR | Status: AC
Start: 2016-10-22 — End: 2016-10-22
  Administered 2016-10-22: 1 mg via INTRAVENOUS
  Filled 2016-10-22: qty 1

## 2016-10-22 MED ORDER — NIFEDIPINE ER OSMOTIC RELEASE 30 MG PO TB24
30.0000 mg | ORAL_TABLET | Freq: Once | ORAL | Status: AC
  Administered 2016-10-22: 30 mg via ORAL
  Filled 2016-10-22: qty 1

## 2016-10-22 MED ORDER — LACTATED RINGERS IV BOLUS (SEPSIS)
1000.0000 mL | Freq: Once | INTRAVENOUS | Status: AC
  Administered 2016-10-22: 1000 mL via INTRAVENOUS

## 2016-10-22 NOTE — Discharge Instructions (Signed)

## 2016-10-22 NOTE — MAU Note (Addendum)
Pt was seen by L.Leftwhich-Kirby, CNM at clinic today.  Pt reports she was 1cm/50.  CNM told pt to come to MAU for further evaluation by her.  No vaginal bleeding or ROM.  Pt states she has had a decreased amount of fetal movement but baby started to move when CNM was evaluating her.

## 2016-10-22 NOTE — MAU Provider Note (Signed)
Chief Complaint:  Contractions   None     HPI: Amber Howard is a 20 y.o. [id number] at [redacted]w[redacted]d pt of [facility] who presents to maternity admissions sent from the office for labor evaluation and decreased fetal movement.  She reports contractions and back pain intermittently x 2 weeks made worse with walking, lifting her 22 year old twins, or after sex.  Nothing makes the pain better, and she has tried drinking more water, resting, changing positions, Flexeril, and Tylenol.  She reports the pain became more frequent today, occurring every 5 minutes making her have to breath through contractions.  She reports feeling fetal movement today but not as much as usual because of the contractions. She denies LOF, vaginal bleeding, vaginal itching/burning, urinary symptoms, h/a, dizziness, n/v, or fever/chills.    HPI  Past Medical History: Past Medical History:  Diagnosis Date  . Anemia   . Fibroids   . Migraine     Past obstetric history: OB History  Gravida Para Term Preterm AB Living  5 2 1 1 2 3   SAB TAB Ectopic Multiple Live Births  2     1 3     # Outcome Date GA Lbr Len/2nd Weight Sex Delivery Anes PTL Lv  5 Current           4A Preterm 07/07/15   4 lb 3 oz (1.899 kg) M CS-LTranv  Y LIV     Complications: IUGR (intrauterine growth restriction)  4B Preterm    4 lb 7 oz (2.013 kg) F CS-LTranv   LIV     Complications: IUGR (intrauterine growth restriction),Breech presentation at birth  3 Term 01/25/12 [redacted]w[redacted]d  6 lb 11 oz (3.033 kg) M CS-LTranv   LIV     Birth Comments: C/s fetal distress, nucal , anemia  2 SAB           1 SAB               Past Surgical History: Past Surgical History:  Procedure Laterality Date  . CESAREAN SECTION  2013  . NOSE SURGERY  2015    Family History: Family History  Problem Relation Age of Onset  . Anemia Mother   . Fibroids Mother     Social History: Social History  Substance Use Topics  . Smoking status: Current Some Day Smoker     Packs/day: 0.25    Types: Cigarettes    Last attempt to quit: 10/20/2014  . Smokeless tobacco: Never Used  . Alcohol use No    Allergies:  Allergies  Allergen Reactions  . 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 Onion Anaphylaxis  . Other Anaphylaxis, Hives, Itching and Other (See Comments)    Pt states that she is allergic to fire ants.    Horris Latino Honey Bee Venom [Honey Bee Venom] Anaphylaxis  . Benadryl [Diphenhydramine] Hives  . [name] Isothiocyanate] Hives  . Orange Fruit [Citrus]     Meds:  Prescriptions Prior to Admission  Medication Sig Dispense Refill Last Dose  . calcium carbonate (TUMS - DOSED IN MG ELEMENTAL CALCIUM) 500 MG chewable tablet Chew 1 tablet by mouth as needed for indigestion or heartburn.   10/21/2016 at Unknown time  . Prenatal MV-Min-FA-Omega-3 (PRENATAL GUMMIES/DHA & FA) 0.4-32.5 MG CHEW Chew 1 each by mouth 2 (two) times daily.   10/21/2016 at Unknown time  . cyclobenzaprine (FLEXERIL) 10 MG tablet Take 1 tablet (10 mg total) by mouth every 8 (eight) hours as needed for muscle spasms. (  Patient not taking: Reported on 10/22/2016) 10 tablet 0 Not Taking  . ferrous sulfate 325 (65 FE) MG tablet Take 1 tablet (325 mg total) by mouth 3 (three) times daily with meals. (Patient not taking: Reported on 10/22/2016) 90 tablet 3 Not Taking  . potassium chloride 20 MEQ TBCR Take 20 mEq by mouth daily. (Patient not taking: Reported on 10/22/2016) 5 tablet 0 Not Taking  . promethazine (PHENERGAN) 25 MG tablet Take 1 tablet (25 mg total) by mouth every 6 (six) hours as needed for nausea or vomiting. (Patient not taking: Reported on 10/22/2016) 30 tablet 0 Not Taking    ROS:  Review of Systems  Constitutional: Negative for chills, fatigue and fever.  Eyes: Negative for visual disturbance.  Respiratory: Negative for shortness of breath.   Cardiovascular: Negative for chest pain.  Gastrointestinal: Positive for abdominal pain. Negative for nausea and vomiting.   Genitourinary: Positive for pelvic pain. Negative for difficulty urinating, dysuria, flank pain, vaginal bleeding, vaginal discharge and vaginal pain.  Musculoskeletal: Positive for back pain.  Neurological: Negative for dizziness and headaches.  Psychiatric/Behavioral: Negative.      I have reviewed patient's Past Medical Hx, Surgical Hx, Family Hx, Social Hx, medications and allergies.   Physical Exam   Patient Vitals for the past 24 hrs:  BP Temp Temp src Pulse Resp SpO2  10/22/16 1237 113/67 97.6 F (36.4 C) Oral 66 18 99 %   Constitutional: Well-developed, well-nourished female in no acute distress.  Cardiovascular: normal rate Respiratory: normal effort GI: Abd soft, non-tender, gravid appropriate for gestational age.  MS: Extremities nontender, no edema, normal ROM Neurologic: Alert and oriented x 4.  GU: Neg CVAT.  Dilation: 1.5 Effacement (%): Thick Cervical Position: Posterior Station: -3 Presentation: Vertex Exam by:: L. Leftwhich-Kirby, CNM  FHT:  Baseline 135, moderate variability, accelerations present, no decelerations Contractions: q 3 mins, mild to moderate to palpation   Labs: No results found for this or any previous visit (from the past 24 hour(s)). A/Positive/-- (03/12 0000)  Imaging:    MAU Course/MDM: I have ordered labs and reviewed results.  NST reviewed and reactive Consult Dr Ernestina Patches with presentation and exam findings No evidence of active labor with no cervical change in 2+ hours in MAU.  Will treat symptoms with Stadol for pain and one dose of Procardia for tocolysis.  Treatments in MAU included Procardia 30 mg XL.   Pt stable at time of discharge.  Assessment: 1. Threatened labor at term   2. Previous preterm delivery, antepartum   3. Obesity in pregnancy, antepartum   4. Previous cesarean delivery affecting pregnancy, antepartum     Plan: Discharge home Labor precautions and fetal kick counts Follow-up [place]  for [facility] .   Specialty:  Obstetrics and Gynecology Contact information: [address] [place] [phone]           Medication List    STOP taking these medications   ferrous sulfate 325 (65 FE) MG tablet   Potassium Chloride ER 20 MEQ Tbcr     TAKE these medications   calcium carbonate 500 MG chewable tablet Commonly known as:  TUMS - dosed in mg elemental calcium Chew 1 tablet by mouth as needed for indigestion or heartburn.   cyclobenzaprine 10 MG tablet Commonly known as:  FLEXERIL Take 1 tablet (10 mg total) by mouth every 8 (eight) hours as needed for muscle spasms.   PRENATAL GUMMIES/DHA & FA 0.4-32.5 MG  Chew Chew 1 each by mouth 2 (two) times daily.   promethazine 25 MG tablet Commonly known as:  PHENERGAN Take 1 tablet (25 mg total) by mouth every 6 (six) hours as needed for nausea or vomiting.       Fatima Blank Certified Nurse-Midwife 10/22/2016 3:13 PM

## 2016-10-22 NOTE — Patient Instructions (Signed)
Labor Precautions Reasons to come to MAU:  1.  Contractions are  5 minutes apart or less, and you cannot walk or talk during them 2.  You have a large gush of fluid, or a trickle of fluid that will not stop and you have to wear a pad 3.  You have bleeding that is bright red, heavier than spotting--like menstrual bleeding (spotting can be normal in early labor or after a check of your cervix) 4.  You do not feel the baby moving like he/she normally does

## 2016-10-22 NOTE — Progress Notes (Signed)
   PRENATAL VISIT NOTE  Subjective:  Amber Howard is a 20 y.o. [phone] at [redacted]w[redacted]d being seen today for ongoing prenatal care.  She is currently monitored for the following issues for this low-risk pregnancy and has Previous cesarean delivery x 2 affecting pregnancy, antepartum; Supervision of normal pregnancy in third trimester; Previous preterm delivery of twins due to IUGR, antepartum; Obesity in pregnancy, antepartum; Group B Streptococcus carrier, +RV culture, currently pregnant; and Indication for care in labor and delivery, antepartum on her problem list.  Patient reports contractions x 2 weeks worsening today, coming every 5-10 minutes.  Contractions: Irregular. Vag. Bleeding: None.  Movement: Present. Denies leaking of fluid.   The following portions of the patient's history were reviewed and updated as appropriate: allergies, current medications, past family history, past medical history, past social history, past surgical history and problem list. Problem list updated.  Objective:   Vitals:   10/22/16 1138  BP: 122/69  Pulse: 90  Weight: 213 lb 3.2 oz (96.7 kg)    Fetal Status: Fetal Heart Rate (bpm): 144   Movement: Present  Presentation: Vertex  General:  Alert, oriented and cooperative. Patient is in no acute distress.  Skin: Skin is warm and dry. No rash noted.   Cardiovascular: Normal heart rate noted  Respiratory: Normal respiratory effort, no problems with respiration noted  Abdomen: Soft, gravid, appropriate for gestational age. Pain/Pressure: Present     Pelvic:  Cervical exam performed Dilation: 1 Effacement (%): 50 Station: Ballotable  Extremities: Normal range of motion.  Edema: None  Mental Status: Normal mood and affect. Normal behavior. Normal judgment and thought content.   Assessment and Plan:  Pregnancy: Amber [id number] at [redacted]w[redacted]d  1. Previous cesarean delivery x 2 affecting pregnancy, antepartum   2. Encounter for supervision of normal pregnancy in third  trimester, unspecified gravidity   3. Threatened labor at term --Pt breathing with contractions while in office visit. Cervix essentially unchanged from previous exam.  Pt breathing with contractions every few minutes while in today's visit. She also reports decreased fetal movement today but has reported movement while in the office.  R/t hx of C/S and decreased movement, pt sent to MAU for labor evaluation/NST.   Term labor symptoms and general obstetric precautions including but not limited to vaginal bleeding, contractions, leaking of fluid and fetal movement were reviewed in detail with the patient. Please refer to After Visit Summary for other counseling recommendations.  Return in about 1 week (around 10/29/2016).  Elvera Maria, CNM

## 2016-10-22 NOTE — Progress Notes (Signed)
Cervical exam due to patient having contractions currently

## 2016-10-23 ENCOUNTER — Telehealth: Payer: Self-pay | Admitting: *Deleted

## 2016-10-23 ENCOUNTER — Encounter (HOSPITAL_COMMUNITY): Payer: Self-pay | Admitting: *Deleted

## 2016-10-23 ENCOUNTER — Inpatient Hospital Stay (HOSPITAL_COMMUNITY)
Admission: AD | Admit: 2016-10-23 | Discharge: 2016-10-23 | Disposition: A | Discharge status: Home / Self Care | Payer: Medicaid Other | Source: Ambulatory Visit | Attending: Obstetrics & Gynecology | Admitting: Obstetrics & Gynecology

## 2016-10-23 DIAGNOSIS — Z3A37 37 weeks gestation of pregnancy: Secondary | ICD-10-CM | POA: Diagnosis not present

## 2016-10-23 DIAGNOSIS — O09219 Supervision of pregnancy with history of pre-term labor, unspecified trimester: Secondary | ICD-10-CM

## 2016-10-23 DIAGNOSIS — Z3493 Encounter for supervision of normal pregnancy, unspecified, third trimester: Secondary | ICD-10-CM | POA: Insufficient documentation

## 2016-10-23 DIAGNOSIS — O4703 False labor before 37 completed weeks of gestation, third trimester: Secondary | ICD-10-CM | POA: Diagnosis not present

## 2016-10-23 DIAGNOSIS — O99210 Obesity complicating pregnancy, unspecified trimester: Secondary | ICD-10-CM

## 2016-10-23 DIAGNOSIS — O34219 Maternal care for unspecified type scar from previous cesarean delivery: Secondary | ICD-10-CM

## 2016-10-23 DIAGNOSIS — O479 False labor, unspecified: Secondary | ICD-10-CM

## 2016-10-23 MED ORDER — ONDANSETRON HCL 4 MG PO TABS
4.0000 mg | ORAL_TABLET | Freq: Once | ORAL | Status: AC
  Administered 2016-10-23: 4 mg via ORAL
  Filled 2016-10-23: qty 1

## 2016-10-23 NOTE — MAU Provider Note (Signed)
Chief Complaint:  Labor Eval   Seen by provider at 1500   HPI: Amber Howard is a 20 y.o. [phone] at 22w1dwho presents to maternity admissions reporting contractions.  Intermittent, some are painful. She reports good fetal movement, denies LOF, vaginal bleeding, vaginal itching/burning, urinary symptoms, h/a, dizziness, n/v, diarrhea, constipation or fever/chills.  She denies headache, visual changes or RUQ abdominal pain. Wants medicine to stop contractions. Also wants membranes stripped to start labor.  Thought she wanted a repeat C/S but now wants to [place]. States she wants a TOLAC if it will make the baby come sooner. Otherwise she wants a C/S. States she knows she cannot have membranes swept until 38 weeks   Abdominal Pain  This is a recurrent problem. The current episode started in the past 7 days. The problem occurs intermittently. The problem has been waxing and waning. The pain is located in the suprapubic region, LLQ and RLQ. The pain is moderate. The quality of the pain is cramping and aching. The abdominal pain does not radiate. Pertinent negatives include no constipation, diarrhea, fever, nausea or vomiting. Nothing aggravates the pain. The pain is relieved by nothing. She has tried nothing for the symptoms.   Past Medical History: Past Medical History:  Diagnosis Date  . Anemia   . Fibroids   . Migraine     Past obstetric history: OB History  Gravida Para Term Preterm AB Living  5 2 1 1 2 3   SAB TAB Ectopic Multiple Live Births  2     1 3     # Outcome Date GA Lbr Len/2nd Weight Sex Delivery Anes PTL Lv  5 Current           4A Preterm 07/07/15   4 lb 3 oz (1.899 kg) M CS-LTranv  Y LIV     Complications: IUGR (intrauterine growth restriction)  4B Preterm    4 lb 7 oz (2.013 kg) F CS-LTranv   LIV     Complications: IUGR (intrauterine growth restriction),Breech presentation at birth  3 Term 01/25/12 [redacted]w[redacted]d  6 lb 11 oz (3.033 kg) M CS-LTranv   LIV     Birth  Comments: C/s fetal distress, nucal , anemia  2 SAB           1 SAB               Past Surgical History: Past Surgical History:  Procedure Laterality Date  . CESAREAN SECTION  2013  . NOSE SURGERY  2015    Family History: Family History  Problem Relation Age of Onset  . Anemia Mother   . Fibroids Mother     Social History: Social History  Substance Use Topics  . Smoking status: Current Some Day Smoker    Packs/day: 0.25    Types: Cigarettes    Last attempt to quit: 10/20/2014  . Smokeless tobacco: Never Used  . Alcohol use No    Allergies:  Allergies  Allergen Reactions  . 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 Onion Anaphylaxis  . Other Anaphylaxis, Hives, Itching and Other (See Comments)    Pt states that she is allergic to fire ants.    Horris Latino Honey Bee Venom [Honey Bee Venom] Anaphylaxis  . Benadryl [Diphenhydramine] Hives  . [name] Isothiocyanate] Hives  . Orange Fruit [Citrus]     Meds:  Prescriptions Prior to Admission  Medication Sig Dispense Refill Last Dose  . calcium carbonate (TUMS - DOSED IN MG ELEMENTAL CALCIUM) 500 MG chewable tablet  Chew 1 tablet by mouth as needed for indigestion or heartburn.   10/21/2016 at Unknown time  . cyclobenzaprine (FLEXERIL) 10 MG tablet Take 1 tablet (10 mg total) by mouth every 8 (eight) hours as needed for muscle spasms. (Patient not taking: Reported on 10/22/2016) 10 tablet 0 Not Taking  . Prenatal MV-Min-FA-Omega-3 (PRENATAL GUMMIES/DHA & FA) 0.4-32.5 MG CHEW Chew 1 each by mouth 2 (two) times daily.   10/21/2016 at Unknown time  . promethazine (PHENERGAN) 25 MG tablet Take 1 tablet (25 mg total) by mouth every 6 (six) hours as needed for nausea or vomiting. (Patient not taking: Reported on 10/22/2016) 30 tablet 0 Not Taking    I have reviewed patient's Past Medical Hx, Surgical Hx, Family Hx, Social Hx, medications and allergies.   ROS:  Review of Systems  Constitutional: Negative for fever.   Gastrointestinal: Positive for abdominal pain. Negative for constipation, diarrhea, nausea and vomiting.   Other systems negative  Physical Exam  Patient Vitals for the past 24 hrs:  BP Temp Temp src Pulse Resp  10/23/16 1358 129/77 98.5 F (36.9 C) Oral 83 18   Constitutional: Well-developed, well-nourished female in no acute distress.  Cardiovascular: normal rate and rhythm Respiratory: normal effort, clear to auscultation bilaterally GI: Abd soft, non-tender, gravid appropriate for gestational age.   No rebound or guarding. MS: Extremities nontender, no edema, normal ROM Neurologic: Alert and oriented x 4.  GU: Neg CVAT.  Dilation: 1.5 Effacement (%): Thick Cervical Position: Posterior Station: -3 Exam by:: Velna Ochs RN  FHT:  Baseline 135 , moderate variability, accelerations present, no decelerations Contractions:  Irregular     Labs: No results found for this or any previous visit (from the past 24 hour(s)). A/Positive/-- (03/12 0000)  Imaging:  [place] Mfm Fetal Bpp Wo Non Stress  Result Date: 10/08/2016 OBSTETRICAL ULTRASOUND: This exam was performed within a [place] [facility] Department. The OB US report was generated in the AS system, and faxed to the ordering physician.  This report is available in the [facility]. See the AS Obstetric US report via the Image Link.  [place] Mfm Ob Follow Up  Result Date: 10/10/2016 OBSTETRICAL ULTRASOUND: This exam was performed within a [place] [facility] Department. The OB US report was generated in the AS system, and faxed to the ordering physician.  This report is available in the [facility]. See the AS Obstetric US report via the Image Link.   MAU Course/MDM: RN asked me to speak withpatient who is upset she is not inlabor NST reviewed   Assessment: 1. Previous preterm delivery, antepartum   2. Obesity in pregnancy, antepartum   3. Previous cesarean delivery affecting pregnancy, antepartum     Plan: Discharge  home Labor precautions and fetal kick counts Follow up in Office for prenatal visits and recheck of cervix Long discussion about labor, TOLAC after 2 c/s and repeat C/S Pt will discuss at her visit next week Encouraged to return here or to other Urgent Care/ED if she develops worsening of symptoms, increase in pain, fever, or other concerning symptoms.   Pt stable at time of discharge.  Hansel Feinstein CNM, MSN Certified Nurse-Midwife 10/23/2016 3:21 PM

## 2016-10-23 NOTE — Discharge Instructions (Signed)
Braxton Hicks Contractions Contractions of the uterus can occur throughout pregnancy. Contractions are not always a sign that you are in labor.  WHAT ARE BRAXTON HICKS CONTRACTIONS?  Contractions that occur before labor are called Braxton Hicks contractions, or false labor. Toward the end of pregnancy (32-34 weeks), these contractions can develop more often and may become more forceful. This is not true labor because these contractions do not result in opening (dilatation) and thinning of the cervix. They are sometimes difficult to tell apart from true labor because these contractions can be forceful and people have different pain tolerances. You should not feel embarrassed if you go to the hospital with false labor. Sometimes, the only way to tell if you are in true labor is for your health care provider to look for changes in the cervix. If there are no prenatal problems or other health problems associated with the pregnancy, it is completely safe to be sent home with false labor and await the onset of true labor. HOW CAN YOU TELL THE DIFFERENCE BETWEEN TRUE AND FALSE LABOR? False Labor  The contractions of false labor are usually shorter and not as hard as those of true labor.   The contractions are usually irregular.   The contractions are often felt in the front of the lower abdomen and in the groin.   The contractions may go away when you walk around or change positions while lying down.   The contractions get weaker and are shorter lasting as time goes on.   The contractions do not usually become progressively stronger, regular, and closer together as with true labor.  True Labor  Contractions in true labor last 30-70 seconds, become very regular, usually become more intense, and increase in frequency.   The contractions do not go away with walking.   The discomfort is usually felt in the top of the uterus and spreads to the lower abdomen and low back.   True labor can be  determined by your health care provider with an exam. This will show that the cervix is dilating and getting thinner.  WHAT TO REMEMBER  Keep up with your usual exercises and follow other instructions given by your health care provider.   Take medicines as directed by your health care provider.   Keep your regular prenatal appointments.   Eat and drink lightly if you think you are going into labor.   If Braxton Hicks contractions are making you uncomfortable:   Change your position from lying down or resting to walking, or from walking to resting.   Sit and rest in a tub of warm water.   Drink 2-3 glasses of water. Dehydration may cause these contractions.   Do slow and deep breathing several times an hour.  WHEN SHOULD I SEEK IMMEDIATE MEDICAL CARE? Seek immediate medical care if:  Your contractions become stronger, more regular, and closer together.   You have fluid leaking or gushing from your vagina.   You have a fever.    You have vaginal bleeding.   You have continuous abdominal pain.   You have low back pain that you never had before.   You feel your baby's head pushing down and causing pelvic pressure.   Your baby is not moving as much as it used to.    This information is not intended to replace advice given to you by your health care provider. Make sure you discuss any questions you have with your health care provider.   Document Released:  12/15/2005 Document Revised: 12/20/2013 Document Reviewed: 09/26/2013 Elsevier Interactive Patient Education [employer].

## 2016-10-23 NOTE — Telephone Encounter (Signed)
Pt called and stated that she is experiencing contractions 2-3 minutes apart. She is also very uncomfortable. I spoke with Dr. Rip Harbour regarding this patient and he recommends that she come back to MAU today for evaluation. Patient is agreeable and will come.

## 2016-10-23 NOTE — MAU Note (Signed)
Pt reports she is having ctx getting closer and closer.  Pt denies any vag bleeding or leaking of fluid. Good fetal movement

## 2016-10-29 ENCOUNTER — Telehealth: Payer: Self-pay | Admitting: *Deleted

## 2016-10-29 NOTE — Telephone Encounter (Signed)
Pt left message stating that she would like a letter for her Mom's employer stating that she will need care after her surgery (C/S scheduled 11/8) for 1 week.  She would like to pick up the letter today or we may fax it. Pt did not state the fax number.  I returned pt's call and stated that we can provide a letter which states that she will benefit/need care from her mother after the birth of her baby. We cannot fax it without a signed release of information form. Pt voiced understanding and stated that she will sign release @ her scheduled visit on 11/3.

## 2016-10-30 ENCOUNTER — Encounter: Payer: Self-pay | Admitting: *Deleted

## 2016-10-31 ENCOUNTER — Ambulatory Visit (INDEPENDENT_AMBULATORY_CARE_PROVIDER_SITE_OTHER): Payer: Medicaid Other | Admitting: Family Medicine

## 2016-10-31 VITALS — BP 97/57 | HR 85

## 2016-10-31 DIAGNOSIS — Z3493 Encounter for supervision of normal pregnancy, unspecified, third trimester: Secondary | ICD-10-CM

## 2016-10-31 DIAGNOSIS — O34219 Maternal care for unspecified type scar from previous cesarean delivery: Secondary | ICD-10-CM

## 2016-10-31 NOTE — Progress Notes (Signed)
   PRENATAL VISIT NOTE  Subjective:  Amber Howard is a 20 y.o. [phone] at [redacted]w[redacted]d being seen today for ongoing prenatal care.  She is currently monitored for the following issues for this low-risk pregnancy and has Previous cesarean delivery x 2 affecting pregnancy, antepartum; Supervision of normal pregnancy in third trimester; Previous preterm delivery of twins due to IUGR, antepartum; Obesity in pregnancy, antepartum; Group B Streptococcus carrier, +RV culture, currently pregnant; and Indication for care in labor and delivery, antepartum on her problem list.  Patient reports occasional contractions. Leaking clear fluid.  Contractions: Irregular.  .  Movement: Present. Denies leaking of fluid.   The following portions of the patient's history were reviewed and updated as appropriate: allergies, current medications, past family history, past medical history, past social history, past surgical history and problem list. Problem list updated.  Objective:   Vitals:   10/31/16 1030  BP: (!) 97/57  Pulse: 85    Fetal Status: Fetal Heart Rate (bpm): 130   Movement: Present  Presentation: Vertex  General:  Alert, oriented and cooperative. Patient is in no acute distress.  Skin: Skin is warm and dry. No rash noted.   Cardiovascular: Normal heart rate noted  Respiratory: Normal respiratory effort, no problems with respiration noted  Abdomen: Soft, gravid, appropriate for gestational age. Pain/Pressure: Present     Pelvic:  Cervical exam performed Dilation: 1.5 Effacement (%): 50 Station: Ballotable. No pooling  Extremities: Normal range of motion.  Edema: None  Mental Status: Normal mood and affect. Normal behavior. Normal judgment and thought content.   Assessment and Plan:  Pregnancy: [name] at [redacted]w[redacted]d  1. Encounter for supervision of normal pregnancy in third trimester, unspecified gravidity FHT normal. Fern negative  2. Previous cesarean delivery x 2 affecting pregnancy,  antepartum Pt scheduled for repeat at 39 weeks. Pt interested in VBAC - discussed risks with patient.   Term labor symptoms and general obstetric precautions including but not limited to vaginal bleeding, contractions, leaking of fluid and fetal movement were reviewed in detail with the patient. Please refer to After Visit Summary for other counseling recommendations.  No Follow-up on file.   Truett Mainland, DO

## 2016-11-04 ENCOUNTER — Encounter (HOSPITAL_COMMUNITY)
Admission: RE | Admit: 2016-11-04 | Discharge: 2016-11-04 | Disposition: A | Discharge status: Home / Self Care | Payer: Medicaid Other | Source: Ambulatory Visit | Attending: Family Medicine | Admitting: Family Medicine

## 2016-11-04 ENCOUNTER — Encounter (HOSPITAL_COMMUNITY): Payer: Self-pay

## 2016-11-04 LAB — CBC
HEMATOCRIT: 26 % — AB (ref 36.0–46.0)
Hemoglobin: 8.2 g/dL — ABNORMAL LOW (ref 12.0–15.0)
MCH: 20.2 pg — ABNORMAL LOW (ref 26.0–34.0)
MCHC: 31.5 g/dL (ref 30.0–36.0)
MCV: 64 fL — ABNORMAL LOW (ref 78.0–100.0)
PLATELETS: 219 10*3/uL (ref 150–400)
RBC: 4.06 MIL/uL (ref 3.87–5.11)
RDW: 17.9 % — AB (ref 11.5–15.5)
WBC: 7 10*3/uL (ref 4.0–10.5)

## 2016-11-04 LAB — ABO/RH: ABO/RH(D): A POS

## 2016-11-04 NOTE — Patient Instructions (Signed)
Amber Howard  11/04/2016   Your procedure is scheduled on:  11/05/2016  Enter through the Main Entrance of [facility] [place] up the phone at the desk and dial [phone].   Call this number if you have problems the morning of surgery: [phone]   Remember:   Do not eat food:After Midnight.  Do not drink clear liquids: 4 Hours before arrival.  Take these medicines the morning of surgery with A SIP OF WATER: none   Do not wear jewelry, make-up or nail polish.  Do not wear lotions, powders, or perfumes. Do not wear deodorant.  Do not shave 48 hours prior to surgery.  Do not bring valuables to the hospital.  [facility] is not   responsible for any belongings or valuables brought to the hospital.  Contacts, dentures or bridgework may not be worn into surgery.  Leave suitcase in the car. After surgery it may be brought to your room.  For patients admitted to the hospital, checkout time is 11:00 AM the day of              discharge.   Patients discharged the day of surgery will not be allowed to drive             home.  Name and phone number of your driver: na  Special Instructions:   N/A   Please read over the following fact sheets that you were given:   Surgical Site Infection Prevention

## 2016-11-05 ENCOUNTER — Inpatient Hospital Stay (HOSPITAL_COMMUNITY): Payer: Medicaid Other | Admitting: Anesthesiology

## 2016-11-05 ENCOUNTER — Encounter (HOSPITAL_COMMUNITY)
Admission: RE | Disposition: A | Discharge status: Home / Self Care | Payer: Self-pay | Source: Ambulatory Visit | Attending: Family Medicine

## 2016-11-05 ENCOUNTER — Inpatient Hospital Stay (HOSPITAL_COMMUNITY)
Admission: RE | Admit: 2016-11-05 | Discharge: 2016-11-08 | DRG: 765 | Disposition: A | Discharge status: Home / Self Care | Payer: Medicaid Other | Source: Ambulatory Visit | Attending: Family Medicine | Admitting: Family Medicine

## 2016-11-05 ENCOUNTER — Encounter (HOSPITAL_COMMUNITY): Payer: Self-pay | Admitting: Anesthesiology

## 2016-11-05 DIAGNOSIS — Z3A39 39 weeks gestation of pregnancy: Secondary | ICD-10-CM | POA: Diagnosis not present

## 2016-11-05 DIAGNOSIS — E669 Obesity, unspecified: Secondary | ICD-10-CM | POA: Diagnosis present

## 2016-11-05 DIAGNOSIS — O99334 Smoking (tobacco) complicating childbirth: Secondary | ICD-10-CM | POA: Diagnosis present

## 2016-11-05 DIAGNOSIS — O99824 Streptococcus B carrier state complicating childbirth: Secondary | ICD-10-CM | POA: Diagnosis present

## 2016-11-05 DIAGNOSIS — O99820 Streptococcus B carrier state complicating pregnancy: Secondary | ICD-10-CM

## 2016-11-05 DIAGNOSIS — O9902 Anemia complicating childbirth: Secondary | ICD-10-CM | POA: Diagnosis present

## 2016-11-05 DIAGNOSIS — F17210 Nicotine dependence, cigarettes, uncomplicated: Secondary | ICD-10-CM | POA: Diagnosis present

## 2016-11-05 DIAGNOSIS — Z98891 History of uterine scar from previous surgery: Secondary | ICD-10-CM

## 2016-11-05 DIAGNOSIS — O34219 Maternal care for unspecified type scar from previous cesarean delivery: Secondary | ICD-10-CM | POA: Diagnosis present

## 2016-11-05 DIAGNOSIS — Z3493 Encounter for supervision of normal pregnancy, unspecified, third trimester: Secondary | ICD-10-CM

## 2016-11-05 DIAGNOSIS — G44209 Tension-type headache, unspecified, not intractable: Secondary | ICD-10-CM | POA: Diagnosis present

## 2016-11-05 DIAGNOSIS — T148XXA Other injury of unspecified body region, initial encounter: Secondary | ICD-10-CM | POA: Diagnosis not present

## 2016-11-05 DIAGNOSIS — Z6841 Body Mass Index (BMI) 40.0 and over, adult: Secondary | ICD-10-CM | POA: Diagnosis not present

## 2016-11-05 DIAGNOSIS — O09219 Supervision of pregnancy with history of pre-term labor, unspecified trimester: Secondary | ICD-10-CM

## 2016-11-05 DIAGNOSIS — O99214 Obesity complicating childbirth: Secondary | ICD-10-CM | POA: Diagnosis present

## 2016-11-05 DIAGNOSIS — O99210 Obesity complicating pregnancy, unspecified trimester: Secondary | ICD-10-CM | POA: Diagnosis present

## 2016-11-05 DIAGNOSIS — O34211 Maternal care for low transverse scar from previous cesarean delivery: Principal | ICD-10-CM | POA: Diagnosis present

## 2016-11-05 DIAGNOSIS — N736 Female pelvic peritoneal adhesions (postinfective): Secondary | ICD-10-CM

## 2016-11-05 LAB — CBC
HCT: 19 % — ABNORMAL LOW (ref 36.0–46.0)
Hemoglobin: 6.1 g/dL — CL (ref 12.0–15.0)
MCH: 20.8 pg — AB (ref 26.0–34.0)
MCHC: 32.1 g/dL (ref 30.0–36.0)
MCV: 64.8 fL — AB (ref 78.0–100.0)
PLATELETS: 185 10*3/uL (ref 150–400)
RBC: 2.93 MIL/uL — AB (ref 3.87–5.11)
RDW: 18.1 % — ABNORMAL HIGH (ref 11.5–15.5)
WBC: 10.7 10*3/uL — ABNORMAL HIGH (ref 4.0–10.5)

## 2016-11-05 LAB — RPR: RPR Ser Ql: NONREACTIVE

## 2016-11-05 LAB — PREPARE RBC (CROSSMATCH)

## 2016-11-05 SURGERY — Surgical Case
Anesthesia: Spinal | Site: Abdomen | Wound class: Clean Contaminated

## 2016-11-05 MED ORDER — ACETAMINOPHEN 160 MG/5ML PO SUSP
650.0000 mg | ORAL | Status: DC | PRN
  Administered 2016-11-06: 650 mg via ORAL
  Filled 2016-11-05 (×2): qty 20.3

## 2016-11-05 MED ORDER — NALBUPHINE HCL 10 MG/ML IJ SOLN
5.0000 mg | INTRAMUSCULAR | Status: DC | PRN
  Administered 2016-11-06: 5 mg via SUBCUTANEOUS
  Filled 2016-11-05: qty 1

## 2016-11-05 MED ORDER — SIMETHICONE 80 MG PO CHEW
80.0000 mg | CHEWABLE_TABLET | ORAL | Status: DC
  Administered 2016-11-06 – 2016-11-07 (×3): 80 mg via ORAL
  Filled 2016-11-05 (×3): qty 1

## 2016-11-05 MED ORDER — ONDANSETRON HCL 4 MG/2ML IJ SOLN
4.0000 mg | Freq: Three times a day (TID) | INTRAMUSCULAR | Status: DC | PRN
  Administered 2016-11-05: 4 mg via INTRAVENOUS
  Filled 2016-11-05: qty 2

## 2016-11-05 MED ORDER — LACTATED RINGERS IV SOLN
INTRAVENOUS | Status: DC | PRN
  Administered 2016-11-05: 40 [IU] via INTRAVENOUS

## 2016-11-05 MED ORDER — SOD CITRATE-CITRIC ACID 500-334 MG/5ML PO SOLN
30.0000 mL | ORAL | Status: DC
  Filled 2016-11-05: qty 30

## 2016-11-05 MED ORDER — COCONUT OIL OIL
1.0000 "application " | TOPICAL_OIL | Status: DC | PRN
  Administered 2016-11-06: 1 via TOPICAL
  Filled 2016-11-05: qty 120

## 2016-11-05 MED ORDER — OXYTOCIN 40 UNITS IN LACTATED RINGERS INFUSION - SIMPLE MED: 2.5000 [IU]/h | INTRAVENOUS | Status: AC

## 2016-11-05 MED ORDER — PRENATAL MULTIVITAMIN CH
1.0000 | ORAL_TABLET | Freq: Every day | ORAL | Status: DC
  Administered 2016-11-06 – 2016-11-08 (×3): 1 via ORAL
  Filled 2016-11-05 (×3): qty 1

## 2016-11-05 MED ORDER — PHENYLEPHRINE 8 MG IN D5W 100 ML (0.08MG/ML) PREMIX OPTIME
INJECTION | INTRAVENOUS | Status: AC
  Filled 2016-11-05: qty 100

## 2016-11-05 MED ORDER — DIPHENHYDRAMINE HCL 50 MG/ML IJ SOLN: 12.5000 mg | INTRAMUSCULAR | Status: DC | PRN

## 2016-11-05 MED ORDER — SCOPOLAMINE 1 MG/3DAYS TD PT72
MEDICATED_PATCH | TRANSDERMAL | Status: AC
  Administered 2016-11-05: 1.5 mg via TRANSDERMAL
  Filled 2016-11-05: qty 1

## 2016-11-05 MED ORDER — DIPHENHYDRAMINE HCL 25 MG PO CAPS: 25.0000 mg | ORAL_CAPSULE | ORAL | Status: DC | PRN

## 2016-11-05 MED ORDER — SCOPOLAMINE 1 MG/3DAYS TD PT72: 1.0000 | MEDICATED_PATCH | Freq: Once | TRANSDERMAL | Status: DC

## 2016-11-05 MED ORDER — SODIUM CHLORIDE 0.9 % IR SOLN
Status: DC | PRN
  Administered 2016-11-05: 1000 mL

## 2016-11-05 MED ORDER — PROMETHAZINE HCL 25 MG/ML IJ SOLN: 6.2500 mg | INTRAMUSCULAR | Status: DC | PRN

## 2016-11-05 MED ORDER — SENNOSIDES-DOCUSATE SODIUM 8.6-50 MG PO TABS
2.0000 | ORAL_TABLET | ORAL | Status: DC
  Administered 2016-11-07 (×2): 2 via ORAL
  Filled 2016-11-05 (×2): qty 2

## 2016-11-05 MED ORDER — PHENYLEPHRINE 8 MG IN D5W 100 ML (0.08MG/ML) PREMIX OPTIME
INJECTION | INTRAVENOUS | Status: DC | PRN
  Administered 2016-11-05: 60 ug/min via INTRAVENOUS

## 2016-11-05 MED ORDER — LACTATED RINGERS IV SOLN
INTRAVENOUS | Status: DC
  Administered 2016-11-05 (×2): via INTRAVENOUS

## 2016-11-05 MED ORDER — LACTATED RINGERS IV BOLUS (SEPSIS): 500.0000 mL | Freq: Once | INTRAVENOUS | Status: DC

## 2016-11-05 MED ORDER — ONDANSETRON HCL 4 MG/2ML IJ SOLN
INTRAMUSCULAR | Status: DC | PRN
  Administered 2016-11-05: 4 mg via INTRAVENOUS

## 2016-11-05 MED ORDER — SIMETHICONE 80 MG PO CHEW
80.0000 mg | CHEWABLE_TABLET | Freq: Three times a day (TID) | ORAL | Status: DC
  Administered 2016-11-06 – 2016-11-08 (×7): 80 mg via ORAL
  Filled 2016-11-05 (×7): qty 1

## 2016-11-05 MED ORDER — LACTATED RINGERS IV SOLN
Freq: Once | INTRAVENOUS | Status: AC
  Administered 2016-11-05: 1000 mL/h via INTRAVENOUS

## 2016-11-05 MED ORDER — ONDANSETRON HCL 4 MG/2ML IJ SOLN
INTRAMUSCULAR | Status: AC
Start: 2016-11-05 — End: 2016-11-05
  Filled 2016-11-05: qty 2

## 2016-11-05 MED ORDER — EPHEDRINE SULFATE 50 MG/ML IJ SOLN
INTRAMUSCULAR | Status: DC | PRN
  Administered 2016-11-05 (×3): 10 mg via INTRAVENOUS

## 2016-11-05 MED ORDER — HYDROMORPHONE HCL 1 MG/ML IJ SOLN
0.2500 mg | INTRAMUSCULAR | Status: DC | PRN
  Administered 2016-11-05 (×2): 0.5 mg via INTRAVENOUS

## 2016-11-05 MED ORDER — OXYTOCIN 10 UNIT/ML IJ SOLN
INTRAMUSCULAR | Status: AC
  Filled 2016-11-05: qty 4

## 2016-11-05 MED ORDER — MORPHINE SULFATE-NACL 0.5-0.9 MG/ML-% IV SOSY
PREFILLED_SYRINGE | INTRAVENOUS | Status: AC
  Filled 2016-11-05: qty 1

## 2016-11-05 MED ORDER — MEPERIDINE HCL 25 MG/ML IJ SOLN: 6.2500 mg | INTRAMUSCULAR | Status: DC | PRN

## 2016-11-05 MED ORDER — OXYCODONE-ACETAMINOPHEN 5-325 MG PO TABS: 1.0000 | ORAL_TABLET | ORAL | Status: DC | PRN

## 2016-11-05 MED ORDER — SODIUM CHLORIDE 0.9% FLUSH: 3.0000 mL | INTRAVENOUS | Status: DC | PRN

## 2016-11-05 MED ORDER — HYDROMORPHONE HCL 1 MG/ML IJ SOLN
INTRAMUSCULAR | Status: AC
  Filled 2016-11-05: qty 1

## 2016-11-05 MED ORDER — MENTHOL 3 MG MT LOZG
1.0000 | LOZENGE | OROMUCOSAL | Status: DC | PRN
  Filled 2016-11-05 (×2): qty 9

## 2016-11-05 MED ORDER — ZOLPIDEM TARTRATE 5 MG PO TABS
5.0000 mg | ORAL_TABLET | Freq: Every evening | ORAL | Status: DC | PRN
Start: 2016-11-05 — End: 2016-11-08

## 2016-11-05 MED ORDER — MORPHINE SULFATE (PF) 0.5 MG/ML IJ SOLN
INTRAMUSCULAR | Status: DC | PRN
  Administered 2016-11-05: .2 mg via EPIDURAL

## 2016-11-05 MED ORDER — BUPIVACAINE IN DEXTROSE 0.75-8.25 % IT SOLN
INTRATHECAL | Status: DC | PRN
  Administered 2016-11-05: 1.6 mL via INTRATHECAL

## 2016-11-05 MED ORDER — NALBUPHINE HCL 10 MG/ML IJ SOLN: 5.0000 mg | Freq: Once | INTRAMUSCULAR | Status: DC | PRN

## 2016-11-05 MED ORDER — DIBUCAINE 1 % RE OINT: 1.0000 "application " | TOPICAL_OINTMENT | RECTAL | Status: DC | PRN

## 2016-11-05 MED ORDER — TETANUS-DIPHTH-ACELL PERTUSSIS 5-2.5-18.5 LF-MCG/0.5 IM SUSP: 0.5000 mL | Freq: Once | INTRAMUSCULAR | Status: DC

## 2016-11-05 MED ORDER — LACTATED RINGERS IV SOLN
INTRAVENOUS | Status: DC
  Administered 2016-11-05: via INTRAVENOUS
  Administered 2016-11-05: 500 mL/h via INTRAVENOUS
  Administered 2016-11-05: 125 mL/h via INTRAVENOUS
  Administered 2016-11-06 (×2): via INTRAVENOUS

## 2016-11-05 MED ORDER — NALBUPHINE HCL 10 MG/ML IJ SOLN
5.0000 mg | INTRAMUSCULAR | Status: DC | PRN
  Administered 2016-11-05 – 2016-11-06 (×2): 5 mg via INTRAVENOUS
  Filled 2016-11-05 (×2): qty 1

## 2016-11-05 MED ORDER — EPHEDRINE 5 MG/ML INJ
INTRAVENOUS | Status: AC
  Filled 2016-11-05: qty 10

## 2016-11-05 MED ORDER — WITCH HAZEL-GLYCERIN EX PADS: 1.0000 "application " | MEDICATED_PAD | CUTANEOUS | Status: DC | PRN

## 2016-11-05 MED ORDER — SCOPOLAMINE 1 MG/3DAYS TD PT72
1.0000 | MEDICATED_PATCH | Freq: Once | TRANSDERMAL | Status: DC
  Administered 2016-11-05: 1.5 mg via TRANSDERMAL

## 2016-11-05 MED ORDER — SIMETHICONE 80 MG PO CHEW: 80.0000 mg | CHEWABLE_TABLET | ORAL | Status: DC | PRN

## 2016-11-05 MED ORDER — CEFAZOLIN SODIUM-DEXTROSE 2-4 GM/100ML-% IV SOLN
2.0000 g | INTRAVENOUS | Status: AC
  Administered 2016-11-05: 2 g via INTRAVENOUS
  Filled 2016-11-05: qty 100

## 2016-11-05 MED ORDER — NALOXONE HCL 0.4 MG/ML IJ SOLN: 0.4000 mg | INTRAMUSCULAR | Status: DC | PRN

## 2016-11-05 MED ORDER — NALOXONE HCL 2 MG/2ML IJ SOSY: 1.0000 ug/kg/h | PREFILLED_SYRINGE | INTRAVENOUS | Status: DC | PRN

## 2016-11-05 MED ORDER — OXYCODONE-ACETAMINOPHEN 5-325 MG PO TABS
2.0000 | ORAL_TABLET | ORAL | Status: DC | PRN
  Administered 2016-11-06 – 2016-11-08 (×12): 2 via ORAL
  Filled 2016-11-05 (×12): qty 2

## 2016-11-05 MED ORDER — BUPIVACAINE HCL (PF) 0.25 % IJ SOLN
INTRAMUSCULAR | Status: AC
  Filled 2016-11-05: qty 30

## 2016-11-05 MED ORDER — ERYTHROMYCIN 5 MG/GM OP OINT
TOPICAL_OINTMENT | OPHTHALMIC | Status: AC
  Filled 2016-11-05: qty 1

## 2016-11-05 MED ORDER — ACETAMINOPHEN 325 MG PO TABS: 650.0000 mg | ORAL_TABLET | ORAL | Status: DC | PRN

## 2016-11-05 MED ORDER — BUPIVACAINE HCL 0.25 % IJ SOLN
INTRAMUSCULAR | Status: DC | PRN
  Administered 2016-11-05: 30 mL

## 2016-11-05 SURGICAL SUPPLY — 32 items
BENZOIN TINCTURE PRP APPL 2/3 (GAUZE/BANDAGES/DRESSINGS) ×3 IMPLANT
CHLORAPREP W/TINT 26ML (MISCELLANEOUS) ×3 IMPLANT
CLOSURE WOUND 1/2 X4 (GAUZE/BANDAGES/DRESSINGS) ×1
CLOTH BEACON ORANGE TIMEOUT ST (SAFETY) ×3 IMPLANT
DRSG OPSITE POSTOP 4X10 (GAUZE/BANDAGES/DRESSINGS) ×3 IMPLANT
ELECT REM PT RETURN 9FT ADLT (ELECTROSURGICAL) ×3
ELECTRODE REM PT RTRN 9FT ADLT (ELECTROSURGICAL) ×1 IMPLANT
GLOVE BIOGEL PI IND STRL 7.0 (GLOVE) ×3 IMPLANT
GLOVE BIOGEL PI INDICATOR 7.0 (GLOVE) ×6
GLOVE ECLIPSE 6.5 STRL STRAW (GLOVE) ×3 IMPLANT
GOWN STRL REUS W/ TWL LRG LVL3 (GOWN DISPOSABLE) ×2 IMPLANT
GOWN STRL REUS W/TWL LRG LVL3 (GOWN DISPOSABLE) ×4
NEEDLE HYPO 22GX1.5 SAFETY (NEEDLE) ×3 IMPLANT
NS IRRIG 1000ML POUR BTL (IV SOLUTION) ×3 IMPLANT
PAD ABD 7.5X8 STRL (GAUZE/BANDAGES/DRESSINGS) ×3 IMPLANT
PAD OB MATERNITY 4.3X12.25 (PERSONAL CARE ITEMS) ×3 IMPLANT
PAD PREP 24X48 CUFFED NSTRL (MISCELLANEOUS) ×3 IMPLANT
RETRACTOR WND ALEXIS 25 LRG (MISCELLANEOUS) IMPLANT
RTRCTR WOUND ALEXIS 25CM LRG (MISCELLANEOUS)
SPONGE GAUZE 4X4 12PLY (GAUZE/BANDAGES/DRESSINGS) ×3 IMPLANT
STRIP CLOSURE SKIN 1/2X4 (GAUZE/BANDAGES/DRESSINGS) ×2 IMPLANT
SUT MON AB-0 CT1 36 (SUTURE) IMPLANT
SUT PDS AB 0 CTX 60 (SUTURE) IMPLANT
SUT PLAIN 2 0 XLH (SUTURE) ×3 IMPLANT
SUT VIC AB 0 CT1 36 (SUTURE) ×6 IMPLANT
SUT VIC AB 2-0 CT1 27 (SUTURE) ×2
SUT VIC AB 2-0 CT1 TAPERPNT 27 (SUTURE) ×1 IMPLANT
SUT VIC AB 4-0 KS 27 (SUTURE) ×3 IMPLANT
SYR CONTROL 10ML LL (SYRINGE) ×3 IMPLANT
TAPE CLOTH SURG 4X10 WHT LF (GAUZE/BANDAGES/DRESSINGS) ×3 IMPLANT
TOWEL OR 17X24 6PK STRL BLUE (TOWEL DISPOSABLE) ×9 IMPLANT
TRAY FOLEY CATH SILVER 16FR (SET/KITS/TRAYS/PACK) ×3 IMPLANT

## 2016-11-05 NOTE — Anesthesia Postprocedure Evaluation (Signed)
Anesthesia Post Note  Patient: Amber Howard  Procedure(s) Performed: Procedure(s) (LRB): CESAREAN SECTION (N/A)  Patient location during evaluation: PACU Anesthesia Type: Spinal and MAC Level of consciousness: awake and alert Pain management: pain level controlled Vital Signs Assessment: post-procedure vital signs reviewed and stable Respiratory status: spontaneous breathing and respiratory function stable Cardiovascular status: blood pressure returned to baseline and stable Postop Assessment: spinal receding Anesthetic complications: no     Last Vitals:  Vitals:   11/05/16 1736 11/05/16 1745  BP: (!) 99/53 (!) 118/103  Pulse: 86 76  Resp: 15 (!) 22  Temp:  36.8 C    Last Pain:  Vitals:   11/05/16 1745  TempSrc:   PainSc: 8    Pain Goal: Patients Stated Pain Goal: 5 (11/05/16 1348)               Nolon Nations

## 2016-11-05 NOTE — Progress Notes (Signed)
Dr Frederico Hamman notified of pressure of 76/35.  Instructed to give another bolus of 500LR

## 2016-11-05 NOTE — Progress Notes (Signed)
Dr Royce Macadamia and Dr Frederico Hamman notified of pt's blood pressure and updated on pt's status.  Orders received

## 2016-11-05 NOTE — Anesthesia Procedure Notes (Signed)
Spinal  Patient location during procedure: OR Staffing Anesthesiologist: Nolon Nations Performed: anesthesiologist  Preanesthetic Checklist Completed: patient identified, site marked, surgical consent, pre-op evaluation, timeout performed, IV checked, risks and benefits discussed and monitors and equipment checked Spinal Block Patient position: sitting Prep: DuraPrep Patient monitoring: heart rate, continuous pulse ox and blood pressure Location: L3-4 Injection technique: single-shot Needle Needle type: Sprotte  Needle gauge: 24 G Needle length: 9 cm Additional Notes Expiration date of kit checked and confirmed. Patient tolerated procedure well, without complications.

## 2016-11-05 NOTE — Op Note (Signed)
Cesarean Section Operative Report  Amber Howard  11/05/2016  Indications: Scheduled Proceedure/Maternal Request and Repeat Cesarean  Pre-operative Diagnosis: REPEAT c-section x 2, 39 0/[redacted] weeks EGA.   Post-operative Diagnosis: Same   Surgeon: Surgeon(s) and Role:    * Caren Macadam, MD - Primary    * [address] [place], DO - [place] Fellow  Attending Attestation: I was present and scrubbed for the entire procedure.   Assistants: Zenda Alpers, DO  Anesthesia: spinal    Estimated Blood Loss: 750 ml  Total IV Fluids: 2300 ml LR  Urine Output:: 150 ml clear urine  Specimens: None  Findings: Viable female infant in cephalic presentation; Apgars 8/8; weight pending; clear amniotic fluid; intact placenta with three vessel cord; normal uterus, fallopian tubes and ovaries bilaterally. Multiple DENSE adhesions to uterus, peritoneum to rectus muscles.  Baby condition / location:  Couplet care / Skin to Skin   Complications: no complications  Indications: Amber Howard is a 20 y.o. [phone] with an IUP [redacted]w[redacted]d presenting for elective repeat cesarean.  The risks, benefits, complications, treatment options, and exected outcomes were discussed with the patient . The patient dwith the proposed plan, giving informed consent. identified as [name] and the procedure verified as C-Section Delivery.  Procedure Details:  The patient was taken back to the operative suite where spinal anesthesia was placed.  A time out was held and the above information confirmed.   After induction of anesthesia, the patient was draped and prepped in the usual sterile manner and placed in a dorsal supine position with a leftward tilt. A Pfannenstiel incision was made and carried down through the subcutaneous tissue to the fascia. Fascial incision was made and extended transversely. The fascia was separated from the underlying rectus tissue superiorly and inferiorly. The  peritoneum, densely adhered to rectus muscles, was identified and entered and extended longitudinally. Multiple dense adhesions from bladder to uterus were taken down carefully, clamped and Bovi excised and suture tied for good hemostasis. Alexis retractor was placed. Bladder flap was created, bladder blade placed. A low transverse uterine incision was made and extended bluntly. Clear amniotic fluid after ROM. Delivered from cephalic presentation was a viable infant with Apgars and weight as above.  After waiting 60 seconds for delayed cord cutting, the umbilical cord was clamped and cut cord blood was obtained for evaluation. Cord ph was not sent. The placenta was removed Intact and appeared normal. The uterine outline, tubes and ovaries appeared normal. The uterine incision was closed with running locked sutures of 0 Vicryl with an imbricating layer of the same.   Hemostasis was observed. Rectus muscle was reapproximated loosely with 2-0 Vicryl with two interrupted sutures. The rectus muscles were examined and hemostasis observed. The fascia was then reapproximated with running sutures of 0Vicryl. A total of 30 ml 0.5% Marcaine was injected subcutaneously at the margins of the incision. The subcuticular closure was performed using 2-0 plain gut. The skin was closed with 4-0Vicryl on a Keith needle.   Instrument, sponge, and needle counts were correct prior the abdominal closure and were correct at the conclusion of the case.     Disposition: PACU - hemodynamically stable.   Maternal Condition: stable    Signed: Zenda Alpers, DO  [facility] Fellow [facility] for [facility], [facility] 11/05/2016 4:34 PM

## 2016-11-05 NOTE — Progress Notes (Signed)
Dr Frederico Hamman called and notified that pt's blood pressure not improving

## 2016-11-05 NOTE — Transfer of Care (Signed)
Immediate Anesthesia Transfer of Care Note  Patient: Amber Howard  Procedure(s) Performed: Procedure(s): CESAREAN SECTION (N/A)  Patient Location: PACU  Anesthesia Type:Spinal  Level of Consciousness: awake, alert  and oriented  Airway & Oxygen Therapy: Patient Spontanous Breathing  Post-op Assessment: Report given to RN and Post -op Vital signs reviewed and stable  Post vital signs: Reviewed and stable  Last Vitals:  Vitals:   11/05/16 1348  BP: 109/72  Pulse: 80  Resp: 16  Temp: 36.8 C    Last Pain:  Vitals:   11/05/16 1348  TempSrc: Oral  PainSc: 9       Patients Stated Pain Goal: 5 ([id number])  Complications: No apparent anesthesia complications

## 2016-11-05 NOTE — Anesthesia Preprocedure Evaluation (Signed)
Anesthesia Evaluation  Patient identified by MRN, date of birth, ID band Patient awake    Reviewed: Allergy & Precautions, NPO status , Patient's Chart, lab work & pertinent test results  Airway Mallampati: II  TM Distance: >3 FB Neck ROM: Full    Dental no notable dental hx.    Pulmonary Current Smoker,    Pulmonary exam normal breath sounds clear to auscultation       Cardiovascular negative cardio ROS Normal cardiovascular exam Rhythm:Regular Rate:Normal     Neuro/Psych  Headaches, negative psych ROS   GI/Hepatic negative GI ROS, Neg liver ROS,   Endo/Other  negative endocrine ROS  Renal/GU negative Renal ROS     Musculoskeletal negative musculoskeletal ROS (+)   Abdominal (+) + obese,   Peds  Hematology negative hematology ROS (+) anemia ,   Anesthesia Other Findings   Reproductive/Obstetrics (+) Pregnancy                             Anesthesia Physical Anesthesia Plan  ASA: III  Anesthesia Plan: Spinal   Post-op Pain Management:    Induction:   Airway Management Planned:   Additional Equipment:   Intra-op Plan:   Post-operative Plan:   Informed Consent: I have reviewed the patients History and Physical, chart, labs and discussed the procedure including the risks, benefits and alternatives for the proposed anesthesia with the patient or authorized representative who has indicated his/her understanding and acceptance.   Dental advisory given  Plan Discussed with:   Anesthesia Plan Comments:         Anesthesia Quick Evaluation

## 2016-11-05 NOTE — H&P (Signed)
Obstetric Preoperative History and Physical  Amber Howard is a 20 y.o. [phone] with IUP at [redacted]w[redacted]d presenting for presenting for scheduled cesarean section.  No acute concerns.   Prenatal Course Source of Care: [name] with onset of care at 61 with [place] weeks Pregnancy complications or risks: Patient Active Problem List   Diagnosis Date Noted  . Indication for care in labor and delivery, antepartum 10/17/2016  . Group B Streptococcus carrier, +RV culture, currently pregnant 10/13/2016  . Previous preterm delivery of twins due to IUGR, antepartum 06/19/2016  . Obesity in pregnancy, antepartum 06/19/2016  . Previous cesarean delivery x 2 affecting pregnancy, antepartum 10/24/2014  . Supervision of normal pregnancy in third trimester 10/24/2014   She plans to breastfeed, plans to bottle feed She desires oral contraceptives (estrogen/progesterone) for postpartum contraception.   Prenatal labs and studies: ABO, Rh: --/--/A POS, A POS (11/07 1115) Antibody: NEG (11/07 1115) Rubella: Immune (03/12 0000) RPR: Non Reactive (11/07 1115)  HBsAg: Negative (03/12 0000)  HIV: NONREACTIVE (09/27 1156)  [id number] (10/13 0000) 1 hr Glucola  89 Genetic screening normal Anatomy US normal  Prenatal Transfer Tool  Maternal Diabetes: No Genetic Screening: Normal Maternal Ultrasounds/Referrals: Normal Fetal Ultrasounds or other Referrals:  None Maternal Substance Abuse:  No Significant Maternal Medications:  None Significant Maternal Lab Results: Lab values include: Group B Strep positive  Past Medical History:  Diagnosis Date  . Anemia   . Fibroids   . Migraine     Past Surgical History:  Procedure Laterality Date  . CESAREAN SECTION  2013  . NOSE SURGERY  2015    OB History  Gravida Para Term Preterm AB Living  5 2 1 1 2 3   SAB TAB Ectopic Multiple Live Births  2     1 3     # Outcome Date GA Lbr Len/2nd Weight Sex Delivery Anes PTL Lv  5 Current           4A Preterm  07/07/15   4 lb 3 oz (1.899 kg) M CS-LTranv  Y LIV     Complications: IUGR (intrauterine growth restriction)  4B Preterm    4 lb 7 oz (2.013 kg) F CS-LTranv   LIV     Complications: IUGR (intrauterine growth restriction),Breech presentation at birth  3 Term 01/25/12 [redacted]w[redacted]d  6 lb 11 oz (3.033 kg) M CS-LTranv   LIV     Birth Comments: C/s fetal distress, nucal , anemia  2 SAB           1 SAB               Social History   Social History  . Marital status: Single    Spouse name: N/A  . Number of children: N/A  . Years of education: N/A   Social History Main Topics  . Smoking status: Current Some Day Smoker    Packs/day: 0.25    Types: Cigarettes    Last attempt to quit: 10/20/2014  . Smokeless tobacco: Never Used  . Alcohol use No  . Drug use: No  . Sexual activity: Yes    Birth control/ protection: None   Other Topics Concern  . None   Social History Narrative  . None    Family History  Problem Relation Age of Onset  . Anemia Mother   . Fibroids Mother     Prescriptions Prior to Admission  Medication Sig Dispense Refill Last Dose  . calcium carbonate (TUMS - DOSED IN MG ELEMENTAL CALCIUM)  500 MG chewable tablet Chew 1 tablet by mouth as needed for indigestion or heartburn.   Past Month at Unknown time  . Prenatal MV-Min-FA-Omega-3 (PRENATAL GUMMIES/DHA & FA) 0.4-32.5 MG CHEW Chew 1 each by mouth 2 (two) times daily.   Past Week at Unknown time  . cyclobenzaprine (FLEXERIL) 10 MG tablet Take 1 tablet (10 mg total) by mouth every 8 (eight) hours as needed for muscle spasms. (Patient not taking: Reported on 10/31/2016) 10 tablet 0 Not Taking  . promethazine (PHENERGAN) 25 MG tablet Take 1 tablet (25 mg total) by mouth every 6 (six) hours as needed for nausea or vomiting. (Patient not taking: Reported on 10/31/2016) 30 tablet 0 Not Taking    Allergies  Allergen Reactions  . 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 Onion Anaphylaxis  . Other Anaphylaxis, Hives, Itching and Other (See  Comments)    Pt states that she is allergic to fire ants.    Horris Latino Honey Bee Venom [Honey Bee Venom] Anaphylaxis  . Benadryl [Diphenhydramine] Hives  . [name] Isothiocyanate] Hives  . Orange Fruit [Citrus]     Review of Systems: Negative except for what is mentioned in HPI.  Physical Exam: BP 109/72   Pulse 80   Temp 98.2 F (36.8 C) (Oral)   Resp 16   LMP 01/30/2016 (Within Weeks)   SpO2 100%    FHR by Doppler: 137 bpm CONSTITUTIONAL: Well-developed, well-nourished female in no acute distress.  HENT:  Normocephalic, atraumatic, External right and left ear normal. Oropharynx is clear and moist EYES: Conjunctivae and EOM are normal. Pupils are equal, round, and reactive to light. No scleral icterus.  NECK: Normal range of motion, supple, no masses SKIN: Skin is warm and dry. No rash noted. Not diaphoretic. No erythema. No pallor. [place]: Alert and oriented to person, place, and time. Normal reflexes, muscle tone coordination. No cranial nerve deficit noted. PSYCHIATRIC: Normal mood and affect. Normal behavior. Normal judgment and thought content. CARDIOVASCULAR: Normal heart rate noted, regular rhythm RESPIRATORY: Effort and breath sounds normal, no problems with respiration noted ABDOMEN: Soft, nontender, nondistended, gravid. Well-healed Pfannenstiel incision. PELVIC: Deferred MUSCULOSKELETAL: Normal range of motion. No edema and no tenderness. 2+ distal pulses.   Pertinent Labs/Studies:   Results for orders placed or performed during the hospital encounter of 11/05/16 (from the past 72 hour(s))  Prepare RBC (crossmatch)     Status: None   Collection Time: 11/05/16  2:30 PM  Result Value Ref Range   Order Confirmation ORDER PROCESSED BY BLOOD BANK     Assessment and Plan :Amber Howard is a 20 y.o. [id number] at [redacted]w[redacted]d being admitted being admitted for scheduled cesarean section. The risks of cesarean section discussed with the patient included but were  not limited to: bleeding which may require transfusion or reoperation; infection which may require antibiotics; injury to bowel, bladder, ureters or other surrounding organs; injury to the fetus; need for additional procedures including hysterectomy in the event of a life-threatening hemorrhage; placental abnormalities wth subsequent pregnancies, incisional problems, thromboembolic phenomenon and other postoperative/anesthesia complications. The patient concurred with the proposed plan, giving informed written consent for the procedure. Patient has been NPO since last night she will remain NPO for procedure. Anesthesia and OR aware. Preoperative prophylactic antibiotics and SCDs ordered on call to the OR. To OR when ready.    Caren Macadam, MD, MPH, ABFM Attending [place] for [facility]

## 2016-11-06 DIAGNOSIS — O34219 Maternal care for unspecified type scar from previous cesarean delivery: Secondary | ICD-10-CM

## 2016-11-06 DIAGNOSIS — Z3A39 39 weeks gestation of pregnancy: Secondary | ICD-10-CM

## 2016-11-06 MED ORDER — SODIUM CHLORIDE 0.9 % IV SOLN: Freq: Once | INTRAVENOUS | Status: DC

## 2016-11-06 MED ORDER — HYDROXYZINE HCL 25 MG PO TABS
25.0000 mg | ORAL_TABLET | Freq: Four times a day (QID) | ORAL | Status: DC | PRN
  Administered 2016-11-06 (×2): 25 mg via ORAL
  Filled 2016-11-06 (×3): qty 1

## 2016-11-06 NOTE — Progress Notes (Signed)
UR chart review completed.  

## 2016-11-06 NOTE — Addendum Note (Signed)
Addendum  created 11/06/16 [id number] by Brock Ra, CRNA   Sign clinical note

## 2016-11-06 NOTE — Plan of Care (Signed)
Problem: Activity: Goal: Will verbalize the importance of balancing activity with adequate rest periods Encourage ambulation with assistance as needed

## 2016-11-06 NOTE — Progress Notes (Signed)
Assisted OOB to Bathroom. Voided and emptied bladder, back to bed without incident.

## 2016-11-06 NOTE — Progress Notes (Signed)
POSTPARTUM PROGRESS NOTE   Post Op Day # 01 Subjective:  Amber Howard is a 20 y.o. [id number] [redacted]w[redacted]d s/p rLTCS (3).  No acute events overnight.  Pt has not been ambulating ambulating and denies problems voiding or po intake.  She denies nausea or vomiting.  Pain is well controlled.  She has had flatus. She has not had bowel movement.  Lochia Small.  Patient denies any dizziness, lightheadedness, Cp, SOB.   Objective: Blood pressure (!) 97/59, pulse 67, temperature 98.5 F (36.9 C), temperature source Oral, resp. rate 18, weight 213 lb (96.6 kg), last menstrual period 01/30/2016, SpO2 97 %, unknown if currently breastfeeding.  Physical Exam:  General: alert, cooperative and no distress Lochia:normal flow Chest: CTAB Heart: RRR with intact distal pulses Abdomen: soft, tenderness at incision site with uterine palpation  Uterine Fundus: firm DVT Evaluation: No calf swelling or tenderness   Recent Labs  11/04/16 1115 11/05/16 2334  HGB 8.2* 6.1*  HCT 26.0* 19.0*    Assessment/Plan:  ASSESSMENT: KAZIAH Howard is a 20 y.o. [id number] [redacted]w[redacted]d s/p rLTCS   - Encouraged ambulation - Foley to be removed - Pending CBC S/P 2U RBCs - Will continue to monitor blood pressures   LOS: 1 day   Rozell Searing, MD 11/06/2016, 7:40 AM

## 2016-11-06 NOTE — Lactation Note (Signed)
This note was copied from a baby's chart. Lactation Consultation Note Mom's 4th baby, has 22 yr old twins. Tried to BF but it was to much for mom to BF w/twins. Mom has 5 finger wide space between soft "V" shaped breast. Has old stretch marks to breast. Everted nipples. Hand expression w/thick colostrum. Mom plans breast and formula feeding for a months or so, then formula bottle feed.  Mom shown how to use DEBP & how to disassemble, clean, & reassemble parts. Mom knows to pump q3h for 15-20 min. Mom encouraged to feed baby 8-12 times/24 hours and with feeding cues. Referred to Baby and Me Book in Breastfeeding section Pg. 22-23 for position options and Proper latch demonstration. Encouraged STS, I&O, supply and demand. [address] brochure given w/resources, support groups and [place] services.  Patient Name: Amber Howard [id number] Date: 11/06/2016 Reason for consult: Initial assessment   Maternal Data Has patient been taught Hand Expression?: Yes Does the patient have breastfeeding experience prior to this delivery?: Yes  Feeding    LATCH Score/Interventions       Type of Nipple: Everted at rest and after stimulation  Comfort (Breast/Nipple): Soft / non-tender           Lactation Tools Discussed/Used Tools: Pump Breast pump type: Double-Electric Breast Pump WIC Program: Yes Pump Review: Setup, frequency, and cleaning;Milk Storage Initiated by:: Allayne Stack RN IBCLC Date initiated:: 11/06/16   Consult Status Consult Status: Follow-up Date: 11/06/16 (in pm) Follow-up type: In-patient    Suzanne Garbers, Elta [place] 11/06/2016, 3:24 AM

## 2016-11-06 NOTE — Anesthesia Postprocedure Evaluation (Signed)
Anesthesia Post Note  Patient: Amber Howard  Procedure(s) Performed: Procedure(s) (LRB): CESAREAN SECTION (N/A)  Patient location during evaluation: Mother Baby Anesthesia Type: Spinal Level of consciousness: awake and alert, oriented and patient cooperative Vital Signs Assessment: post-procedure vital signs reviewed and stable Respiratory status: spontaneous breathing, nonlabored ventilation and respiratory function stable Cardiovascular status: stable Postop Assessment: no headache, patient able to bend at knees and no signs of nausea or vomiting Anesthetic complications: no Comments: Patient states she has pain level at 9. Patient sitting, appears comfortable, but states her pain is 'all over generalized pain'. Currently patient is waiting for RN to bring medication. Educated patient regarding post operative pain management and mediaction options.     Last Vitals:  Vitals:   11/06/16 0405 11/06/16 0652  BP: 96/60 (!) 97/59  Pulse: 77 67  Resp: 18 18  Temp: 37.3 C 36.9 C    Last Pain: 9 Vitals:   11/06/16 0652  TempSrc: Oral  PainSc:    Pain Goal: Patients Stated Pain Goal: 5 (11/05/16 1348)               Brinkley Peet

## 2016-11-06 NOTE — Progress Notes (Signed)
CSW acknowledged consult and met with MOB at [facility] request.  MOB was polite and inviting.  When CSW arrived MOB was engaging in skin to skin with infant and FOB was watching TV.  MOB gave CSW permission to meet with MOB while FOB was present. CSW inquired about MOB's housing and MOB communicated that MOB has secured housing and the children were doing well.  MOB requested information about obtaining a car seat.  CSW inquired to see MOB's plans for transportation at d/c.  MOB reported that MOB and FOB will more than likely ride [place] home.  CSW informed MOB that infant will not require a car seat if MOB will be utilizing the bus system.  MOB stated MOB will follow-up with CSW regarding definite transportation plans in the AM.  Laurey Arrow, MSW, LCSW Clinical Social Work [phone]

## 2016-11-07 LAB — TYPE AND SCREEN
ABO/RH(D): A POS
ANTIBODY SCREEN: NEGATIVE
UNIT DIVISION: 0
Unit division: 0

## 2016-11-07 LAB — CBC
HCT: 22 % — ABNORMAL LOW (ref 36.0–46.0)
Hemoglobin: 7.4 g/dL — ABNORMAL LOW (ref 12.0–15.0)
MCH: 23.1 pg — AB (ref 26.0–34.0)
MCHC: 33.6 g/dL (ref 30.0–36.0)
MCV: 68.8 fL — ABNORMAL LOW (ref 78.0–100.0)
PLATELETS: 185 10*3/uL (ref 150–400)
RBC: 3.2 MIL/uL — AB (ref 3.87–5.11)
RDW: 20.5 % — AB (ref 11.5–15.5)
WBC: 16.9 10*3/uL — AB (ref 4.0–10.5)

## 2016-11-07 NOTE — Progress Notes (Signed)
POSTPARTUM PROGRESS NOTE  Post Operative Day 2 Subjective:  Amber Howard is a 20 y.o. [id number] [redacted]w[redacted]d s/p RLTCS.  No acute events overnight.  Pt denies problems with ambulating, voiding or po intake.  She denies nausea or vomiting.  Pain is well controlled.  She has had flatus. She has not had bowel movement.  Lochia Minimal.   Patient does complain of mild anterior chest and shoulder pain. Seems to be improving however.  Objective: Blood pressure (!) 104/51, pulse 84, temperature 98 F (36.7 C), temperature source Oral, resp. rate 16, weight 213 lb (96.6 kg), last menstrual period 01/30/2016, SpO2 97 %, unknown if currently breastfeeding.  Physical Exam:  General: alert, cooperative and no distress Lochia:normal flow Chest: no respiratory distress Heart:regular rate, distal pulses intact Incision: c/d/i, minimal serosanguinous D/C on honeycomb Abdomen: soft, nontender,  Uterine Fundus: firm, appropriately tender DVT Evaluation: No calf swelling or tenderness Extremities: trace edema   Recent Labs  11/06/16 1115 11/07/16 0815  HGB 8.1* 7.4*  HCT 24.3* 22.0*    Assessment/Plan:  ASSESSMENT: Amber Howard is a 20 y.o. [id number] [redacted]w[redacted]d s/p RLTCS  Repeat CBC given Shoulder pain to ensure no abdominal bleeding causing referred pain. Monitor for improvement. Plan for discharge tomorrow   LOS: 2 days   Brayton Mars 11/07/2016, 3:05 PM

## 2016-11-07 NOTE — Lactation Note (Signed)
This note was copied from a baby's chart. Lactation Consultation Note; Mom has been giving some bottles of formula. States she is unsure of how much the baby is getting. Encouraged to always breast feed first, the give formula if baby is still hungry. States she has pumped a few times but did not obtain any Colostrum. States she can get a drop with hand expression. Encouragement given. Reviewed supply and demand  Baby asleep in mom's arms- had formula at last feeding. Mom reports baby latches well with no pain. No questions at present. To call for assist prn  Patient Name: Amber Howard [id number] Date: 11/07/2016 Reason for consult: Follow-up assessment   Maternal Data Formula Feeding for Exclusion: Yes Reason for exclusion: Mother's choice to formula and breast feed on admission Has patient been taught Hand Expression?: Yes Does the patient have breastfeeding experience prior to this delivery?: Yes  Feeding Feeding Type: Bottle Fed - Formula  LATCH Score/Interventions                      Lactation Tools Discussed/Used WIC Program: Yes   Consult Status Consult Status: Follow-up Date: 11/08/16 Follow-up type: In-patient    Truddie Crumble 11/07/2016, 2:01 PM

## 2016-11-08 MED ORDER — BUTALBITAL-APAP-CAFFEINE 50-325-40 MG PO TABS: 1.0000 | ORAL_TABLET | Freq: Four times a day (QID) | ORAL | 0 refills | Status: DC | PRN

## 2016-11-08 MED ORDER — BUTALBITAL-APAP-CAFFEINE 50-325-40 MG PO TABS
2.0000 | ORAL_TABLET | Freq: Once | ORAL | Status: AC
Start: 2016-11-08 — End: 2016-11-08
  Administered 2016-11-08: 2 via ORAL
  Filled 2016-11-08: qty 2

## 2016-11-08 MED ORDER — OXYCODONE-ACETAMINOPHEN 5-325 MG PO TABS: 1.0000 | ORAL_TABLET | ORAL | 0 refills | Status: DC | PRN

## 2016-11-08 NOTE — Progress Notes (Signed)
Patient c/o having migraine headache started yesterday 11/07/2016.  Patient states she had migraine during pregnancy she was prescribed medication for it.  Patient reports percocet only help with incisional/abdominal pain but not her headache.   Patient advised to talk to OB this morning during round.

## 2016-11-08 NOTE — Progress Notes (Signed)
CSW received a call from [place], [place] on mother baby [place] regarding MOB requesting assistance with obtaining a car seat. After chart review, this writer saw noted in chart that [place], [place] met with MOB on Thursday, 11/06/2016 and assessed MOB's need for a car seat. At that time, MOB noted to [facility] that she and FOB would be taking [employer] (bus) home upon d/c thus, would not necessitate a car seat.   This Probation officer spoke with MOB over the phone to address the above. At this time, MOB notes her mother was going to pick she baby and FOB up from hospital in her car thus, she would need a car seat. This Probation officer inquired if MOB had $30.00 to put towards car seat. MOB verbalized she only has income from child support and she uses that to pay her rent and other bills which were due at the beginning of this month and she has no money left-over. This Probation officer explained to MOB that the hospital does not typically provide car seats without parents providing payment of $30.00; however, this writer will contact house coverage to inquire what can be done.   This Probation officer contacted house coverage [employer] who noted she is unable to authorize giving a car seat for free thus would not be ale to provide MOB with one.   This Probation officer contacted Jackelyn Poling, RN back and made her aware of the above. This Probation officer noted to [employer] she can provide MOB wit two [employer] passes to d/c home due to a car seat not being required for bus rides.   This Probation officer met with MOB at bedside to inform her of the above. At this time, MOB verbalized understanding and was appreciative of [employer] passes given. No other needs were addressed or requested thus, case closed to this CSW.   Margart Zemanek, MSW, LCSW-A Clinical Social Worker  [facility]  Office: [phone]

## 2016-11-08 NOTE — Discharge Summary (Signed)
OB Discharge Summary     Patient Name: Amber Howard DOB: [birth year] MRN: [id number]  Date of admission: 11/05/2016 Delivering MD: Lauretta Chester NILES   Date of discharge: 11/08/2016  Admitting diagnosis: cpt [phone] - REPEAT c-section x 2 Intrauterine pregnancy: [redacted]w[redacted]d     Secondary diagnosis:  Principal Problem:   S/P repeat low transverse C-section Active Problems:   Previous cesarean delivery x 2 affecting pregnancy, antepartum   Supervision of normal pregnancy in third trimester   Previous preterm delivery of twins due to IUGR, antepartum   Obesity in pregnancy, antepartum   Group B Streptococcus carrier, +RV culture, currently pregnant   Indication for care in labor and delivery, antepartum   Pelvic adhesive disease  Additional problems: 2uPRBC, tension type HA     Discharge diagnosis: Term Pregnancy Delivered                                                                                                Post partum procedures:blood transfusion  Augmentation: n/a  Complications: [id number]  Hospital course:  Sceduled C/S   20 y.o. yo [id number] at [redacted]w[redacted]d was admitted to the hospital 11/05/2016 for scheduled cesarean section with the following indication:Elective Repeat.  Membrane Rupture Time/Date: 3:45 PM ,11/05/2016   Patient delivered a Viable infant.11/05/2016  Details of operation can be found in separate operative note.  Pateint had an uncomplicated postpartum course.  She is ambulating, tolerating a regular diet, passing flatus, and urinating well. Has had a HA in the top and sides of her head off and on throughout the pregnancy. Patient is discharged home in stable condition on  11/08/16          Physical exam  Vitals:   11/06/16 1751 11/07/16 0550 11/07/16 1819 11/08/16 0540  BP: (!) 105/48 (!) 104/51 (!) 114/59 119/67  Pulse: 83 84 96 89  Resp: 18 16 18 18   Temp: 99.7 F (37.6 C) 98 F (36.7 C) 98.2 F (36.8 C) 98.5 F (36.9 C)  TempSrc:  Oral Oral  Oral  SpO2:      Weight:       General: alert, cooperative and no distress Lochia: appropriate Uterine Fundus: firm Incision: Healing well with no significant drainage DVT Evaluation: No evidence of DVT seen on physical exam. Labs: Lab Results  Component Value Date   WBC 16.9 (H) 11/07/2016   HGB 7.4 (L) 11/07/2016   HCT 22.0 (L) 11/07/2016   MCV 68.8 (L) 11/07/2016   PLT 185 11/07/2016   CMP Latest Ref Rng & Units 10/06/2016  Glucose 65 - 99 mg/dL 84  BUN 6 - 20 mg/dL 3(L)  Creatinine 0.44 - 1.00 mg/dL 0.50  Sodium 135 - 145 mmol/L 137  Potassium 3.5 - 5.1 mmol/L 3.3(L)  Chloride 101 - 111 mmol/L 102  CO2 20 - 31 mmol/L -  Calcium 8.6 - 10.2 mg/dL -  Total Protein 6.1 - 8.1 g/dL -  Total Bilirubin 0.2 - 1.2 mg/dL -  Alkaline Phos 33 - 115 U/L -  AST 10 - 30 U/L -  ALT 6 - 29 U/L -  Discharge instruction: per After Visit Summary and "Baby and Me Booklet".  After visit meds:    Medication List    STOP taking these medications   calcium carbonate 500 MG chewable tablet Commonly known as:  TUMS - dosed in mg elemental calcium   cyclobenzaprine 10 MG tablet Commonly known as:  FLEXERIL   promethazine 25 MG tablet Commonly known as:  PHENERGAN     TAKE these medications   butalbital-acetaminophen-caffeine 50-325-40 MG tablet Commonly known as:  FIORICET, ESGIC Take 1-2 tablets by mouth every 6 (six) hours as needed for headache.   oxyCODONE-acetaminophen 5-325 MG tablet Commonly known as:  PERCOCET/ROXICET Take 1 tablet by mouth every 4 (four) hours as needed (pain scale 4-7).   PRENATAL GUMMIES/DHA & FA 0.4-32.5 MG Chew Chew 1 each by mouth 2 (two) times daily.       Diet: routine diet  Activity: Advance as tolerated. Pelvic rest for 6 weeks.   Outpatient follow up: Follow up Appt: Future Appointments Date Time Provider [place]  12/08/2016 2:40 PM Seabron Spates, CNM [facility]   Follow up Visit:No Follow-up on  file.  Postpartum contraception: IUD Mirena  Newborn Data: Live born female  Birth Weight: 6 lb 6.1 oz (2895 g) APGAR: 8, 8  Baby Feeding: Breast Disposition:home with mother   11/08/2016 Wyvonnia Dusky, CNM

## 2016-11-08 NOTE — Discharge Instructions (Signed)
Cesarean Delivery, Care After  Refer to this sheet in the next few weeks. These instructions provide you with information on caring for yourself after your procedure. Your health care provider may also give you specific instructions. Your treatment has been planned according to current medical practices, but problems sometimes occur. Call your health care provider if you have any problems or questions after you go home.  HOME CARE INSTRUCTIONS   Only take over-the-counter or prescription medications as directed by your health care provider.   Do not drink alcohol, especially if you are breastfeeding or taking medication to relieve pain.   Do not chew or smoke tobacco.   Continue to use good perineal care. Good perineal care includes:    Wiping your perineum from front to back.    Keeping your perineum clean.   Check your surgical cut (incision) daily for increased redness, drainage, swelling, or separation of skin.   Clean your incision gently with soap and water every day, and then pat it dry. If your health care provider says it is okay, leave the incision uncovered. Use a bandage (dressing) if the incision is draining fluid or appears irritated. If the adhesive strips across the incision do not fall off within 7 days, carefully peel them off.   Hug a pillow when coughing or sneezing until your incision is healed. This helps to relieve pain.   Do not use tampons or douche until your health care provider says it is okay.   Shower, wash your hair, and take tub baths as directed by your health care provider.   Wear a well-fitting bra that provides breast support.   Limit wearing support panties or control-top hose.   Drink enough fluids to keep your urine clear or pale yellow.   Eat high-fiber foods such as whole grain cereals and breads, brown rice, beans, and fresh fruits and vegetables every day. These foods may help prevent or relieve constipation.   Resume activities such as climbing stairs,  driving, lifting, exercising, or traveling as directed by your health care provider.   Talk to your health care provider about resuming sexual activities. This is dependent upon your risk of infection, your rate of healing, and your comfort and desire to resume sexual activity.   Try to have someone help you with your household activities and your newborn for at least a few days after you leave the hospital.   Rest as much as possible. Try to rest or take a nap when your newborn is sleeping.   Increase your activities gradually.   Keep all of your scheduled postpartum appointments. It is very important to keep your scheduled follow-up appointments. At these appointments, your health care provider will be checking to make sure that you are healing physically and emotionally.  SEEK MEDICAL CARE IF:    You are passing large clots from your vagina. Save any clots to show your health care provider.   You have a foul smelling discharge from your vagina.   You have trouble urinating.   You are urinating frequently.   You have pain when you urinate.   You have a change in your bowel movements.   You have increasing redness, pain, or swelling near your incision.   You have pus draining from your incision.   Your incision is separating.   You have painful, hard, or reddened breasts.   You have a severe headache.   You have blurred vision or see spots.   You feel sad   or depressed.   You have thoughts of hurting yourself or your newborn.   You have questions about your care, the care of your newborn, or medications.   You are dizzy or light-headed.   You have a rash.   You have pain, redness, or swelling at the site of the removed intravenous access (IV) tube.   You have nausea or vomiting.   You stopped breastfeeding and have not had a menstrual period within 12 weeks of stopping.   You are not breastfeeding and have not had a menstrual period within 12 weeks of delivery.   You have a fever.  SEEK  IMMEDIATE MEDICAL CARE IF:   You have persistent pain.   You have chest pain.   You have shortness of breath.   You faint.   You have leg pain.   You have stomach pain.   Your vaginal bleeding saturates 2 or more sanitary pads in 1 hour.  MAKE SURE YOU:    Understand these instructions.   Will watch your condition.   Will get help right away if you are not doing well or get worse.     This information is not intended to replace advice given to you by your health care provider. Make sure you discuss any questions you have with your health care provider.     Document Released: 09/06/2002 Document Revised: 01/05/2015 Document Reviewed: 08/11/2012  Elsevier Interactive Patient Education 2016 Elsevier Inc[employer]

## 2016-11-08 NOTE — Lactation Note (Signed)
This note was copied from a baby's chart. Lactation Consultation Note  Patient Name: Boy Malissia Hagemeister [id number] Date: 11/08/2016 Reason for consult: Follow-up assessment Infant is 75 hours old & seen by [facility] for follow-up assessment. Baby was in bassinet when [facility] entered & mom stated she was about to BF. Mom inquired about a nipple shield because she said her nipples were cracked; explained that we would need to assess a latch to see if that is necessary. Mom reports that the latch is not painful but that she notices later that her nipples are sore & appeared cracked. Mom has been using coconut oil. LC brought mom some Comfort Gels and encouraged mom to use after BF to help with any tissue breakdown.  When [facility] came back, mom was about to start feeding & latched him on her left breast in the cradle hold while she sat towards the edge of the bed. Baby latched right away but his mouth was not very wide; mom was able to adjust position to uncurl his top lip & pull his chin down slightly to achieve a deeper latch. As was noted in a previous LC's note, mom has wide spacing between her breasts and are still fairly soft; LC did note some firmer areas behind mom's areolas. Swallows were noted but baby required frequent stimulation to keep him suckling & swallowing. Encouraged mom to do breast massage & breast compression while baby was latched to entice him to continue suckling. Also suggested BF with him just in a diaper & putting a blanket over both of them to help him stay awake during BF. Baby fed for ~10-15 mins on her left breast and then came off & was content. Mom's nipple was slightly compressed on the bottom half of her nipple- showed mom & encouraged her to continue working on achieving a deeper latch. Suggested mom try either football or laid-back breastfeeding on her right breast; mom decided football hold. Baby latched after a few attempts, showing mom how to wait until baby's mouth was very wide  before bringing him to breast. Baby again suckled & swallows were noted but again needed frequent stimulation. Mom reported no discomfort of latch. Baby BF for another 10-15 mins and then came off the breast; mom's nipple was round after un-attaching. Mom again asked about a nipple shield & LC discussed how at this time, it is not needed but that she could see the [facility] or [facility] later for another assessment if she desires to see if it is needed then. Encouraged mom to continue BF at least 8-12x in 24hrs and keep him actively suckling & swallowing on her 1st breast until he is content and then offer her 2nd breast. Mom reports she plans to continue BF & supplementing and reports she had low milk supply with her previous babies as well so wants to supplement. Mom reports no questions at this time & knows to call if she has any later.  Maternal Data    Feeding Feeding Type: Breast Fed Length of feed: 15 min  LATCH Score/Interventions                      Lactation Tools Discussed/Used WIC Program: Yes   Consult Status Consult Status: Complete    Yvonna Alanis 11/08/2016, 12:24 PM

## 2016-11-10 ENCOUNTER — Observation Stay (HOSPITAL_COMMUNITY)
Admission: AD | Admit: 2016-11-10 | Discharge: 2016-11-12 | Disposition: A | Discharge status: Home / Self Care | Payer: Medicaid Other | Source: Ambulatory Visit | Attending: Family Medicine | Admitting: Family Medicine

## 2016-11-10 ENCOUNTER — Encounter (HOSPITAL_COMMUNITY): Payer: Self-pay | Admitting: *Deleted

## 2016-11-10 ENCOUNTER — Inpatient Hospital Stay (HOSPITAL_COMMUNITY): Payer: Medicaid Other

## 2016-11-10 DIAGNOSIS — R109 Unspecified abdominal pain: Secondary | ICD-10-CM | POA: Insufficient documentation

## 2016-11-10 DIAGNOSIS — O99335 Smoking (tobacco) complicating the puerperium: Secondary | ICD-10-CM | POA: Insufficient documentation

## 2016-11-10 DIAGNOSIS — O9081 Anemia of the puerperium: Secondary | ICD-10-CM | POA: Diagnosis not present

## 2016-11-10 DIAGNOSIS — T148XXA Other injury of unspecified body region, initial encounter: Secondary | ICD-10-CM

## 2016-11-10 DIAGNOSIS — D649 Anemia, unspecified: Secondary | ICD-10-CM | POA: Insufficient documentation

## 2016-11-10 DIAGNOSIS — K5900 Constipation, unspecified: Secondary | ICD-10-CM | POA: Insufficient documentation

## 2016-11-10 DIAGNOSIS — O9089 Other complications of the puerperium, not elsewhere classified: Principal | ICD-10-CM | POA: Insufficient documentation

## 2016-11-10 DIAGNOSIS — R1900 Intra-abdominal and pelvic swelling, mass and lump, unspecified site: Secondary | ICD-10-CM

## 2016-11-10 DIAGNOSIS — F17210 Nicotine dependence, cigarettes, uncomplicated: Secondary | ICD-10-CM | POA: Diagnosis not present

## 2016-11-10 DIAGNOSIS — O902 Hematoma of obstetric wound: Secondary | ICD-10-CM | POA: Insufficient documentation

## 2016-11-10 DIAGNOSIS — G43909 Migraine, unspecified, not intractable, without status migrainosus: Secondary | ICD-10-CM | POA: Diagnosis not present

## 2016-11-10 DIAGNOSIS — L7682 Other postprocedural complications of skin and subcutaneous tissue: Secondary | ICD-10-CM

## 2016-11-10 DIAGNOSIS — G8918 Other acute postprocedural pain: Secondary | ICD-10-CM

## 2016-11-10 LAB — CBC
HCT: 24.3 % — ABNORMAL LOW (ref 36.0–46.0)
HCT: 22.9 % — ABNORMAL LOW (ref 36.0–46.0)
HEMOGLOBIN: 8.1 g/dL — AB (ref 12.0–15.0)
HEMOGLOBIN: 7.6 g/dL — AB (ref 12.0–15.0)
MCH: 22.9 pg — AB (ref 26.0–34.0)
MCH: 23.2 pg — AB (ref 26.0–34.0)
MCHC: 33.3 g/dL (ref 30.0–36.0)
MCHC: 33.2 g/dL (ref 30.0–36.0)
MCV: 68.8 fL — ABNORMAL LOW (ref 78.0–100.0)
MCV: 69.8 fL — AB (ref 78.0–100.0)
PLATELETS: 161 10*3/uL (ref 150–400)
Platelets: 312 10*3/uL (ref 150–400)
RBC: 3.53 MIL/uL — AB (ref 3.87–5.11)
RBC: 3.28 MIL/uL — ABNORMAL LOW (ref 3.87–5.11)
RDW: 20.4 % — ABNORMAL HIGH (ref 11.5–15.5)
RDW: 22.2 % — ABNORMAL HIGH (ref 11.5–15.5)
WBC: 10.6 10*3/uL — AB (ref 4.0–10.5)
WBC: 11 10*3/uL — ABNORMAL HIGH (ref 4.0–10.5)

## 2016-11-10 LAB — URINE MICROSCOPIC-ADD ON

## 2016-11-10 LAB — URINALYSIS, ROUTINE W REFLEX MICROSCOPIC
BILIRUBIN URINE: NEGATIVE
GLUCOSE, UA: NEGATIVE mg/dL
Ketones, ur: 15 mg/dL — AB
Nitrite: NEGATIVE
PH: 8 (ref 5.0–8.0)
Protein, ur: 30 mg/dL — AB
Specific Gravity, Urine: 1.01 (ref 1.005–1.030)

## 2016-11-10 MED ORDER — IOPAMIDOL (ISOVUE-300) INJECTION 61%
100.0000 mL | Freq: Once | INTRAVENOUS | Status: AC | PRN
Start: 2016-11-10 — End: 2016-11-10
  Administered 2016-11-10: 100 mL via INTRAVENOUS

## 2016-11-10 MED ORDER — LACTATED RINGERS IV SOLN
INTRAVENOUS | Status: DC
  Administered 2016-11-10: 22:00:00 via INTRAVENOUS
  Administered 2016-11-11: 125 mL/h via INTRAVENOUS

## 2016-11-10 MED ORDER — CALCIUM CARBONATE ANTACID 500 MG PO CHEW: 2.0000 | CHEWABLE_TABLET | ORAL | Status: DC | PRN

## 2016-11-10 MED ORDER — LACTATED RINGERS IV BOLUS (SEPSIS)
1000.0000 mL | Freq: Once | INTRAVENOUS | Status: AC
  Administered 2016-11-10: 1000 mL via INTRAVENOUS

## 2016-11-10 MED ORDER — AMOXICILLIN-POT CLAVULANATE 875-125 MG PO TABS
1.0000 | ORAL_TABLET | Freq: Two times a day (BID) | ORAL | Status: DC
  Administered 2016-11-10 – 2016-11-12 (×4): 1 via ORAL
  Filled 2016-11-10 (×5): qty 1

## 2016-11-10 MED ORDER — OXYCODONE-ACETAMINOPHEN 5-325 MG PO TABS
1.0000 | ORAL_TABLET | ORAL | Status: DC | PRN
  Administered 2016-11-10 – 2016-11-11 (×3): 2 via ORAL
  Filled 2016-11-10 (×3): qty 2

## 2016-11-10 MED ORDER — HYDROMORPHONE HCL 1 MG/ML IJ SOLN
1.0000 mg | Freq: Once | INTRAMUSCULAR | Status: AC
  Administered 2016-11-10: 1 mg via INTRAVENOUS
  Filled 2016-11-10: qty 1

## 2016-11-10 MED ORDER — DOCUSATE SODIUM 100 MG PO CAPS
100.0000 mg | ORAL_CAPSULE | Freq: Every day | ORAL | Status: DC
  Administered 2016-11-11: 100 mg via ORAL
  Filled 2016-11-10: qty 1

## 2016-11-10 MED ORDER — HYDROMORPHONE HCL 1 MG/ML IJ SOLN
1.0000 mg | Freq: Once | INTRAMUSCULAR | Status: AC
Start: 2016-11-10 — End: 2016-11-10
  Administered 2016-11-10: 1 mg via INTRAVENOUS
  Filled 2016-11-10: qty 1

## 2016-11-10 MED ORDER — ZOLPIDEM TARTRATE 5 MG PO TABS
5.0000 mg | ORAL_TABLET | Freq: Every evening | ORAL | Status: DC | PRN
  Administered 2016-11-10: 5 mg via ORAL
  Filled 2016-11-10: qty 1

## 2016-11-10 MED ORDER — IOPAMIDOL (ISOVUE-300) INJECTION 61%
30.0000 mL | INTRAVENOUS | Status: AC
  Administered 2016-11-10 (×2): 30 mL via ORAL

## 2016-11-10 MED ORDER — ACETAMINOPHEN 325 MG PO TABS
650.0000 mg | ORAL_TABLET | ORAL | Status: DC | PRN
  Administered 2016-11-10 – 2016-11-12 (×3): 650 mg via ORAL
  Filled 2016-11-10 (×3): qty 2

## 2016-11-10 MED ORDER — HYDROMORPHONE HCL 1 MG/ML IJ SOLN
1.0000 mg | Freq: Once | INTRAMUSCULAR | Status: AC
  Administered 2016-11-10: 1 mg via INTRAMUSCULAR
  Filled 2016-11-10: qty 1

## 2016-11-10 MED ORDER — HYDROMORPHONE HCL 1 MG/ML IJ SOLN
2.0000 mg | INTRAMUSCULAR | Status: DC | PRN
  Administered 2016-11-11: 2 mg via INTRAVENOUS
  Filled 2016-11-10: qty 2

## 2016-11-10 MED ORDER — PROMETHAZINE HCL 25 MG/ML IJ SOLN
12.5000 mg | Freq: Once | INTRAMUSCULAR | Status: AC
  Administered 2016-11-10: 12.5 mg via INTRAVENOUS
  Filled 2016-11-10: qty 1

## 2016-11-10 NOTE — H&P (Signed)
Chief Complaint: Post-op Problem   First Provider Initiated Contact with Patient 11/10/16 1517      SUBJECTIVE HPI: Amber Howard is a 20 y.o. [id number] who is POD #5 following RLTCS who presents to maternity admissions reporting she felt and heard a pop in her low abdomen last night which was followed by an increase in her abdominal pain. She reports her pain has not been well controlled since her C/S but was improved by taking 2 Percocet until this pop and now is untouched by the medications.  Moving at all causes more pain, standing is excruciating.  Even taking deep breaths causes more abdominal pain. The pain is constant and burning, but increases with movement to sharp pain radiating into her low back and down both legs. She reports her lochia stopped completely 2 days ago then started again today light with small clots and has an unusual odor. She denies vaginal itching/burning, urinary symptoms, h/a, dizziness, n/v, or fever/chills.     HPI  Past Medical History:  Diagnosis Date  . Anemia   . Fibroids   . Migraine    Past Surgical History:  Procedure Laterality Date  . CESAREAN SECTION  2013  . CESAREAN SECTION N/A 11/05/2016   Procedure: CESAREAN SECTION;  Surgeon: Caren Macadam, MD;  Location: [facility];  Service: Obstetrics;  Laterality: N/A;  . NOSE SURGERY  2015   Social History   Social History  . Marital status: Married    Spouse name: N/A  . Number of children: N/A  . Years of education: N/A   Occupational History  . Not on file.   Social History Main Topics  . Smoking status: Current Some Day Smoker    Packs/day: 0.25    Types: Cigarettes    Last attempt to quit: 10/20/2014  . Smokeless tobacco: Never Used  . Alcohol use No  . Drug use: No  . Sexual activity: Yes    Birth control/ protection: None   Other Topics Concern  . Not on file   Social History Narrative  . No narrative on file   No current facility-administered  medications on file prior to encounter.    Current Outpatient Prescriptions on File Prior to Encounter  Medication Sig Dispense Refill  . butalbital-acetaminophen-caffeine (FIORICET, ESGIC) 50-325-40 MG tablet Take 1-2 tablets by mouth every 6 (six) hours as needed for headache. 20 tablet 0   Allergies  Allergen Reactions  . 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 Bee Venom Anaphylaxis  . Onion Anaphylaxis  . Other Anaphylaxis, Hives, Itching and Other (See Comments)    Pt states that she is allergic to fire ants.    . Benadryl [Diphenhydramine] Hives  . [name] Isothiocyanate] Hives  . Orange Fruit [Citrus] Hives    ROS:  Review of Systems  Constitutional: Negative for chills, fatigue and fever.  Respiratory: Negative for shortness of breath.   Cardiovascular: Negative for chest pain.  Gastrointestinal: Positive for abdominal pain.  Genitourinary: Positive for pelvic pain and vaginal bleeding. Negative for difficulty urinating, dysuria, flank pain, vaginal discharge and vaginal pain.  Neurological: Negative for dizziness and headaches.  Psychiatric/Behavioral: Negative.      I have reviewed patient's Past Medical Hx, Surgical Hx, Family Hx, Social Hx, medications and allergies.   Physical Exam   Patient Vitals for the past 24 hrs:  BP Temp Temp src Pulse Resp SpO2  11/10/16 1932 123/69 99.7 F (37.6 C) Axillary 99 18 -  11/10/16 1730 94/63 99.9 F (  37.7 C) Oral 94 18 -  11/10/16 1439 117/64 100.3 F (37.9 C) Oral 96 20 100 %   Constitutional: Well-developed, well-nourished female in no acute distress.  HEART: normal rate, heart sounds, regular rhythm RESP: normal effort, lung sounds clear and equal bilaterally GI: Abd soft, significant tenderness below umbilicus and down to incision.  Uterine fundus palpable 1cm below umbilicus. Pos BS x 4 MS: Extremities nontender, no edema, normal ROM Neurologic: Alert and oriented x 4.  GU: Neg CVAT.  PELVIC EXAM: Deferred   LAB  RESULTS Results for orders placed or performed during the hospital encounter of 11/10/16 (from the past 24 hour(s))  CBC     Status: Abnormal   Collection Time: 11/10/16  3:46 PM  Result Value Ref Range   WBC 11.0 (H) 4.0 - 10.5 K/uL   RBC 3.28 (L) 3.87 - 5.11 MIL/uL   Hemoglobin 7.6 (L) 12.0 - 15.0 g/dL   HCT 22.9 (L) 36.0 - 46.0 %   MCV 69.8 (L) 78.0 - 100.0 fL   MCH 23.2 (L) 26.0 - 34.0 pg   MCHC 33.2 30.0 - 36.0 g/dL   RDW 22.2 (H) 11.5 - 15.5 %   Platelets 312 150 - 400 K/uL    --/--/A POS, A POS (11/07 1115)  IMAGING US Pelvis Complete  Result Date: 11/10/2016 CLINICAL DATA:  C-section 11/05/2016. History of fibroids. Gravida 5 para 3 SAB to. Postpartum. EXAM: TRANSABDOMINAL ULTRASOUND OF PELVIS TECHNIQUE: Transabdominal ultrasound examination of the pelvis was performed including evaluation of the uterus, ovaries, adnexal regions, and pelvic cul-de-sac. COMPARISON:  10/10/2016 FINDINGS: Uterus Measurements: 17.7 x 9.2 x 14.2 cm. No discrete fibroids are identified. Uterus is anteflexed. Endometrium Thickness: Within the lower uterine segment there is a small collection of fluid, likely indicating blood, blood clot, or debris following delivery. The endometrial thickness otherwise appears normal. Right ovary Measurements: The ovary is not visualized, either absent or obscured . No adnexal mass identified. Left ovary Measurements: The ovary is not visualized, either absent or obscured . No adnexal mass identified. Other findings: Within the anterior pelvis, slightly to the left, there is an irregular fluid collection measuring 9.5 by 0 7.0 x 3.6 cm. This collection is between the bladder, the uterus, and anterior abdominal wall. This raises question of a abscess or hematoma. IMPRESSION: 1. Postpartum uterus. 2. Small area of fluid within the lower uterine segment endometrial canal, likely related to postpartum clot or debris. 3. Irregular anterior pelvic collection suspicious for abscess  or hematoma measuring 9.5 x 7.0 x 3.6 cm. As needed, CT may be helpful for further characterization. Electronically Signed   By: Nolon Nations M.D.   On: 11/10/2016 17:30    MAU Management/MDM: Ordered labs and [place] and reviewed results. Dilaudid 1 mg IM given with some improvement in pain.  [place] results indicate pelvic mass, abscess vs hematoma. Consult [facility] with assessment, labs, [place] results.    CT scan ordered.  IV started and Dilaudid 1 mg IV given.  Report to Marcille Buffy, CNM.    Fatima Blank Certified Nurse-Midwife 11/10/2016  8:15 PM  2235: D/W Dr. Nehemiah Settle. Will place in obs for pain management.   Assessment/Plan: Postpartum C-section postoperative hematoma  Admit to 3rd floor Augmentin BID Dilaudid for pain control  Mathis Bud  10:44 PM 11/10/16

## 2016-11-10 NOTE — MAU Provider Note (Signed)
Chief Complaint: Post-op Problem   First Provider Initiated Contact with Patient 11/10/16 1517      SUBJECTIVE HPI: Amber Howard is a 20 y.o. [id number] who is POD #5 following RLTCS who presents to maternity admissions reporting she felt and heard a pop in her low abdomen last night which was followed by an increase in her abdominal pain. She reports her pain has not been well controlled since her C/S but was improved by taking 2 Percocet until this pop and now is untouched by the medications.  Moving at all causes more pain, standing is excruciating.  Even taking deep breaths causes more abdominal pain. The pain is constant and burning, but increases with movement to sharp pain radiating into her low back and down both legs. She reports her lochia stopped completely 2 days ago then started again today light with small clots and has an unusual odor. She denies vaginal itching/burning, urinary symptoms, h/a, dizziness, n/v, or fever/chills.     HPI  Past Medical History:  Diagnosis Date  . Anemia   . Fibroids   . Migraine    Past Surgical History:  Procedure Laterality Date  . CESAREAN SECTION  2013  . CESAREAN SECTION N/A 11/05/2016   Procedure: CESAREAN SECTION;  Surgeon: Caren Macadam, MD;  Location: [facility];  Service: Obstetrics;  Laterality: N/A;  . NOSE SURGERY  2015   Social History   Social History  . Marital status: Married    Spouse name: N/A  . Number of children: N/A  . Years of education: N/A   Occupational History  . Not on file.   Social History Main Topics  . Smoking status: Current Some Day Smoker    Packs/day: 0.25    Types: Cigarettes    Last attempt to quit: 10/20/2014  . Smokeless tobacco: Never Used  . Alcohol use No  . Drug use: No  . Sexual activity: Yes    Birth control/ protection: None   Other Topics Concern  . Not on file   Social History Narrative  . No narrative on file   No current facility-administered  medications on file prior to encounter.    Current Outpatient Prescriptions on File Prior to Encounter  Medication Sig Dispense Refill  . butalbital-acetaminophen-caffeine (FIORICET, ESGIC) 50-325-40 MG tablet Take 1-2 tablets by mouth every 6 (six) hours as needed for headache. 20 tablet 0   Allergies  Allergen Reactions  . 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 Bee Venom Anaphylaxis  . Onion Anaphylaxis  . Other Anaphylaxis, Hives, Itching and Other (See Comments)    Pt states that she is allergic to fire ants.    . Benadryl [Diphenhydramine] Hives  . [name] Isothiocyanate] Hives  . Orange Fruit [Citrus] Hives    ROS:  Review of Systems  Constitutional: Negative for chills, fatigue and fever.  Respiratory: Negative for shortness of breath.   Cardiovascular: Negative for chest pain.  Gastrointestinal: Positive for abdominal pain.  Genitourinary: Positive for pelvic pain and vaginal bleeding. Negative for difficulty urinating, dysuria, flank pain, vaginal discharge and vaginal pain.  Neurological: Negative for dizziness and headaches.  Psychiatric/Behavioral: Negative.      I have reviewed patient's Past Medical Hx, Surgical Hx, Family Hx, Social Hx, medications and allergies.   Physical Exam   Patient Vitals for the past 24 hrs:  BP Temp Temp src Pulse Resp SpO2  11/10/16 1932 123/69 99.7 F (37.6 C) Axillary 99 18 -  11/10/16 1730 94/63 99.9 F (  37.7 C) Oral 94 18 -  11/10/16 1439 117/64 100.3 F (37.9 C) Oral 96 20 100 %   Constitutional: Well-developed, well-nourished female in no acute distress.  HEART: normal rate, heart sounds, regular rhythm RESP: normal effort, lung sounds clear and equal bilaterally GI: Abd soft, significant tenderness below umbilicus and down to incision.  Uterine fundus palpable 1cm below umbilicus. Pos BS x 4 MS: Extremities nontender, no edema, normal ROM Neurologic: Alert and oriented x 4.  GU: Neg CVAT.  PELVIC EXAM: Deferred   LAB  RESULTS Results for orders placed or performed during the hospital encounter of 11/10/16 (from the past 24 hour(s))  CBC     Status: Abnormal   Collection Time: 11/10/16  3:46 PM  Result Value Ref Range   WBC 11.0 (H) 4.0 - 10.5 K/uL   RBC 3.28 (L) 3.87 - 5.11 MIL/uL   Hemoglobin 7.6 (L) 12.0 - 15.0 g/dL   HCT 22.9 (L) 36.0 - 46.0 %   MCV 69.8 (L) 78.0 - 100.0 fL   MCH 23.2 (L) 26.0 - 34.0 pg   MCHC 33.2 30.0 - 36.0 g/dL   RDW 22.2 (H) 11.5 - 15.5 %   Platelets 312 150 - 400 K/uL    --/--/A POS, A POS (11/07 1115)  IMAGING US Pelvis Complete  Result Date: 11/10/2016 CLINICAL DATA:  C-section 11/05/2016. History of fibroids. Gravida 5 para 3 SAB to. Postpartum. EXAM: TRANSABDOMINAL ULTRASOUND OF PELVIS TECHNIQUE: Transabdominal ultrasound examination of the pelvis was performed including evaluation of the uterus, ovaries, adnexal regions, and pelvic cul-de-sac. COMPARISON:  10/10/2016 FINDINGS: Uterus Measurements: 17.7 x 9.2 x 14.2 cm. No discrete fibroids are identified. Uterus is anteflexed. Endometrium Thickness: Within the lower uterine segment there is a small collection of fluid, likely indicating blood, blood clot, or debris following delivery. The endometrial thickness otherwise appears normal. Right ovary Measurements: The ovary is not visualized, either absent or obscured . No adnexal mass identified. Left ovary Measurements: The ovary is not visualized, either absent or obscured . No adnexal mass identified. Other findings: Within the anterior pelvis, slightly to the left, there is an irregular fluid collection measuring 9.5 by 0 7.0 x 3.6 cm. This collection is between the bladder, the uterus, and anterior abdominal wall. This raises question of a abscess or hematoma. IMPRESSION: 1. Postpartum uterus. 2. Small area of fluid within the lower uterine segment endometrial canal, likely related to postpartum clot or debris. 3. Irregular anterior pelvic collection suspicious for abscess  or hematoma measuring 9.5 x 7.0 x 3.6 cm. As needed, CT may be helpful for further characterization. Electronically Signed   By: Nolon Nations M.D.   On: 11/10/2016 17:30    MAU Management/MDM: Ordered labs and [place] and reviewed results. Dilaudid 1 mg IM given with some improvement in pain.  [place] results indicate pelvic mass, abscess vs hematoma. Consult [facility] with assessment, labs, [place] results.    CT scan ordered.  IV started and Dilaudid 1 mg IV given.  Report to Marcille Buffy, CNM.    Fatima Blank Certified Nurse-Midwife 11/10/2016  8:15 PM  2235: D/W Dr. Nehemiah Settle. Will place in obs for pain management.   Assessment/Plan: Postpartum C-section postoperative hematoma  Admit to 3rd floor Augmentin BID Dilaudid for pain control  Mathis Bud  10:44 PM 11/10/16

## 2016-11-10 NOTE — MAU Note (Addendum)
Arrive via EMS. C/S on 11/8. States she felt a "pop" inside her incision area around 9PM last night. Felt sharp pain and pain went down her leg. Took percocet and pain was not relieved. Now out of percocet, Rx dated 11/11. Upon initial exam, incision appears to be healing. Steri strips strips are soiled and starting to peel off. No bleeding or drainage.States her back spasms have started again and goes down her legs and she can't move.

## 2016-11-11 ENCOUNTER — Encounter (HOSPITAL_COMMUNITY): Payer: Self-pay | Admitting: *Deleted

## 2016-11-11 DIAGNOSIS — T148XXA Other injury of unspecified body region, initial encounter: Secondary | ICD-10-CM

## 2016-11-11 LAB — CBC
HCT: 22.3 % — ABNORMAL LOW (ref 36.0–46.0)
Hemoglobin: 7.1 g/dL — ABNORMAL LOW (ref 12.0–15.0)
MCH: 22.4 pg — ABNORMAL LOW (ref 26.0–34.0)
MCHC: 31.8 g/dL (ref 30.0–36.0)
MCV: 70.3 fL — ABNORMAL LOW (ref 78.0–100.0)
PLATELETS: 310 10*3/uL (ref 150–400)
RBC: 3.17 MIL/uL — AB (ref 3.87–5.11)
RDW: 22.6 % — ABNORMAL HIGH (ref 11.5–15.5)
WBC: 10.1 10*3/uL (ref 4.0–10.5)

## 2016-11-11 MED ORDER — INFLUENZA VAC SPLIT QUAD 0.5 ML IM SUSY: 0.5000 mL | PREFILLED_SYRINGE | INTRAMUSCULAR | Status: DC

## 2016-11-11 MED ORDER — PNEUMOCOCCAL VAC POLYVALENT 25 MCG/0.5ML IJ INJ: 0.5000 mL | INJECTION | INTRAMUSCULAR | Status: DC

## 2016-11-11 MED ORDER — POLYETHYLENE GLYCOL 3350 17 G PO PACK
17.0000 g | PACK | Freq: Every day | ORAL | Status: DC
  Administered 2016-11-11 – 2016-11-12 (×2): 17 g via ORAL
  Filled 2016-11-11 (×2): qty 1

## 2016-11-11 MED ORDER — BUTALBITAL-APAP-CAFFEINE 50-325-40 MG PO TABS
1.0000 | ORAL_TABLET | Freq: Once | ORAL | Status: AC
  Administered 2016-11-11: 1 via ORAL
  Filled 2016-11-11 (×2): qty 1

## 2016-11-11 MED ORDER — CYCLOBENZAPRINE HCL 5 MG PO TABS
5.0000 mg | ORAL_TABLET | Freq: Three times a day (TID) | ORAL | Status: DC | PRN
  Filled 2016-11-11: qty 1

## 2016-11-11 MED ORDER — LIDOCAINE 5 % EX PTCH
1.0000 | MEDICATED_PATCH | CUTANEOUS | Status: DC
  Administered 2016-11-11: 1 via TRANSDERMAL
  Filled 2016-11-11 (×3): qty 1

## 2016-11-11 MED ORDER — FERROUS GLUCONATE 324 (38 FE) MG PO TABS
324.0000 mg | ORAL_TABLET | Freq: Two times a day (BID) | ORAL | Status: DC
  Administered 2016-11-11 – 2016-11-12 (×2): 324 mg via ORAL
  Filled 2016-11-11 (×4): qty 1

## 2016-11-11 MED ORDER — SENNA 8.6 MG PO TABS
1.0000 | ORAL_TABLET | Freq: Every day | ORAL | Status: DC
  Administered 2016-11-11 – 2016-11-12 (×2): 8.6 mg via ORAL
  Filled 2016-11-11 (×3): qty 1

## 2016-11-11 MED ORDER — HYDROMORPHONE HCL 2 MG PO TABS
2.0000 mg | ORAL_TABLET | Freq: Four times a day (QID) | ORAL | Status: DC | PRN
  Administered 2016-11-11: 2 mg via ORAL
  Administered 2016-11-11 – 2016-11-12 (×3): 4 mg via ORAL
  Filled 2016-11-11 (×4): qty 2

## 2016-11-11 MED ORDER — CYCLOBENZAPRINE HCL 5 MG PO TABS
5.0000 mg | ORAL_TABLET | Freq: Once | ORAL | Status: AC
  Administered 2016-11-11: 5 mg via ORAL
  Filled 2016-11-11: qty 1

## 2016-11-11 NOTE — Progress Notes (Signed)
Assumed care of pt after receiving report from [facility].   2007: pt called out. Wants to see doctor.  2018: MD resident notified and report status given.

## 2016-11-11 NOTE — Progress Notes (Addendum)
Postpartum Progress Note  Admission Date: 11/10/2016 Current Date: 11/11/2016 11:33 AM  Amber Howard is a 20 y.o. [id number] HD#2 admitted for abdominal pain, ?infected hematoma/POD#6 s/p scheduled repeat c-section  History complicated by GBS positive, tobacco abuse, pelvic adhesive disease, post op anemia  ROS and patient/family/surgical history, located on admission H&P note dated 11/10/2016, have been reviewed, and there are no changes except as noted below Yesterday/Overnight Events:  Patient endorses stable but continued low belly pain and discomfort  Subjective:  Patient states that it doesn't really feels incisional but feels deeper to that with sharpness, similar to what you get when you hit your funny bone on your arm. No nausea, vomiting, fevers, chills. +minimal lochia and flatus but patient does feel constipation. Taking PO w/o difficulty. Feeling tired but no chest pain, SOB or s/s of anemia.   Objective:    Current Vital Signs 24h Vital Sign Ranges  T 98.9 F (37.2 C) Temp  Avg: 99.8 F (37.7 C)  Min: 98.7 F (37.1 C)  Max: 101.8 F (38.8 C)  BP (!) 106/59 BP  Min: 94/63  Max: 123/67  HR 87 Pulse  Avg: 91.6  Min: 86  Max: 99  RR 18 Resp  Avg: 18  Min: 16  Max: 20  SaO2 97 % Not Delivered SpO2  Avg: 99.3 %  Min: 97 %  Max: 100 %       24 Hour I/O Current Shift I/O  Time Ins Outs 11/13 0701 - 11/14 0700 In: 2037.1 [P.O.:1180; I.V.:857.1] Out: 550 [Urine:550] 11/14 0701 - 11/14 1900 In: -  Out: 800 [Urine:800]   Patient Vitals for the past 24 hrs:  BP Temp Temp src Pulse Resp SpO2 Height Weight  11/11/16 1000 (!) 106/59 98.9 F (37.2 C) Oral 87 18 97 % - -  11/11/16 0510 103/65 99.1 F (37.3 C) Oral 90 16 100 % - -  11/11/16 0205 111/61 98.7 F (37.1 C) Axillary 89 16 100 % - -  11/10/16 2306 - - - - - - 5\' 1"  (1.549 m) 93.1 kg (205 lb 4 oz)  11/10/16 2257 123/67 (!) 101.8 F (38.8 C) Oral 86 20 - - -  11/10/16 1932 123/69 99.7 F (37.6 C) Axillary  99 18 - - -  11/10/16 1730 94/63 99.9 F (37.7 C) Oral 94 18 - - -  11/10/16 1439 117/64 100.3 F (37.9 C) Oral 96 20 100 % - -   Last temp: 38.8 on 11/13 @ 2300 Physical exam: General appearance: alert, cooperative and appears stated age Abdomen: +BS, soft, nttp. Incision c/d/i (steri strips removed) and no e/o infection. 2-3 subcm slight superificial skin separation but nothing deep and non weeping, non tender GU: No gross VB Lungs: clear to auscultation bilaterally Heart: S1, S2 normal, no murmur, rub or gallop, regular rate and rhythm Extremities: no c/c/e. SCDs on Skin: warm and dry Psych: appropriate Neurologic: Grossly normal  Medications Current Facility-Administered Medications  Medication Dose Route Frequency Provider Last Rate Last Dose  . acetaminophen (TYLENOL) tablet 650 mg  650 mg Oral Q4H PRN Tresea Mall, CNM   650 mg at 11/10/16 2320  . amoxicillin-clavulanate (AUGMENTIN) 875-125 MG per tablet 1 tablet  1 tablet Oral BID Tresea Mall, CNM   1 tablet at 11/11/16 [phone]  . calcium carbonate (TUMS - dosed in mg elemental calcium) chewable tablet 400 mg of elemental calcium  2 tablet Oral Q4H PRN Tresea Mall, CNM      .  docusate sodium (COLACE) capsule 100 mg  100 mg Oral Daily Tresea Mall, CNM   100 mg at 11/11/16 [id number]  . HYDROmorphone (DILAUDID) injection 2 mg  2 mg Intravenous Q3H PRN Tresea Mall, CNM   2 mg at 11/11/16 0700  . lactated ringers infusion   Intravenous Continuous Tresea Mall, CNM 125 mL/hr at 11/11/16 [id number] 125 mL/hr at 11/11/16 0614  . oxyCODONE-acetaminophen (PERCOCET/ROXICET) 5-325 MG per tablet 1-2 tablet  1-2 tablet Oral Q4H PRN Tresea Mall, CNM   2 tablet at 11/11/16 0941  . zolpidem (AMBIEN) tablet 5 mg  5 mg Oral QHS PRN Tresea Mall, CNM   5 mg at 11/10/16 2320   Labs   Recent Labs Lab 11/07/16 0815 11/10/16 1546 11/11/16 0711  WBC 16.9* 11.0* 10.1  HGB 7.4* 7.6* 7.1*  HCT 22.0* 22.9* 22.3*  PLT 185 312 310    Pending: none  Radiology No new imaging  Assessment & Plan:  Pt stable *PP: routine PP care *Pain: will d/c PO percocet and switch to PO dilaudid and will also add on lidoderm patches.  No e/o dehiscence on CT scan and hematoma could be source of pain and temp yesterday or could be normal post op changes. Will continue to follow.  *ID: continue augmentin D#2. Pan culture if patient spikes again *Anemia: continue with iron bid  *FEN/GI: regular diet, BM regimen (will add senna and miralax) and can saline lock IV *PPx: SCDs, OOB with help *Dispo: pending better pain control  Code Status: Full Code  Total time taking care of the patient was 15 minutes, with greater than 50% of the time spent in face to face interaction with the patient.  Durene Romans MD Attending Center for [facility])

## 2016-11-11 NOTE — Progress Notes (Signed)
Patient requesting pain med., rating pain in  abdomen and back 10 out of 10.  Patient asleep before med was scanned.  Information sheet on pain score given to patient and explained.  Unable to keep eyes open.  Last medicated for pain at 2320 and was asleep since.

## 2016-11-11 NOTE — Progress Notes (Signed)
Refuses to take any more pain medicine until she speaks to doctor about pain control.  Dr. Ilda Basset notified.

## 2016-11-11 NOTE — Progress Notes (Signed)
Offered ice pack for the back of her neck or head for the headache. Patient refused.  Left ice pack at bedside in case she changes her mind.

## 2016-11-11 NOTE — Progress Notes (Signed)
OB Note 11/11/2016 2114  Plan of care d/w pt and re: pain management and agreeable to plan and pt hopeful for d/c tomorrow.  Durene Romans MD Attending Center for [employer] [facility])

## 2016-11-12 LAB — CBC WITH DIFFERENTIAL/PLATELET
Basophils Absolute: 0 10*3/uL (ref 0.0–0.1)
Basophils Relative: 0 %
Eosinophils Absolute: 0.1 10*3/uL (ref 0.0–0.7)
Eosinophils Relative: 1 %
HEMATOCRIT: 22 % — AB (ref 36.0–46.0)
HEMOGLOBIN: 7.1 g/dL — AB (ref 12.0–15.0)
LYMPHS ABS: 2.2 10*3/uL (ref 0.7–4.0)
LYMPHS PCT: 19 %
MCH: 22.5 pg — AB (ref 26.0–34.0)
MCHC: 32.3 g/dL (ref 30.0–36.0)
MCV: 69.8 fL — AB (ref 78.0–100.0)
MONOS PCT: 7 %
Monocytes Absolute: 0.7 10*3/uL (ref 0.1–1.0)
NEUTROS ABS: 8.3 10*3/uL — AB (ref 1.7–7.7)
NEUTROS PCT: 74 %
Platelets: 346 10*3/uL (ref 150–400)
RBC: 3.15 MIL/uL — AB (ref 3.87–5.11)
RDW: 22.7 % — ABNORMAL HIGH (ref 11.5–15.5)
WBC: 11.3 10*3/uL — AB (ref 4.0–10.5)

## 2016-11-12 MED ORDER — OXYCODONE HCL 10 MG PO TABS: 10.0000 mg | ORAL_TABLET | Freq: Four times a day (QID) | ORAL | 0 refills | Status: DC | PRN

## 2016-11-12 MED ORDER — FERROUS GLUCONATE 324 (38 FE) MG PO TABS: 324.0000 mg | ORAL_TABLET | Freq: Two times a day (BID) | ORAL | 1 refills | Status: DC

## 2016-11-12 MED ORDER — HYDROMORPHONE HCL 2 MG/ML IJ SOLN
1.5000 mg | Freq: Once | INTRAMUSCULAR | Status: AC
  Administered 2016-11-12: 1.5 mg via INTRAMUSCULAR
  Filled 2016-11-12: qty 1

## 2016-11-12 MED ORDER — SENNA 8.6 MG PO TABS: 1.0000 | ORAL_TABLET | Freq: Every day | ORAL | 0 refills | Status: DC | PRN

## 2016-11-12 MED ORDER — POLYETHYLENE GLYCOL 3350 17 G PO PACK: 17.0000 g | PACK | Freq: Every day | ORAL | 1 refills | Status: DC

## 2016-11-12 MED ORDER — AMOXICILLIN-POT CLAVULANATE 875-125 MG PO TABS: 1.0000 | ORAL_TABLET | Freq: Two times a day (BID) | ORAL | 0 refills | Status: AC

## 2016-11-12 NOTE — Progress Notes (Signed)
Patient has received discharge paperwork and hard script for narcotic.  Questions, answers and teach back performed at this time. Patient informed this nurse that a "her husband walked across the street to [employer] and a white person gave him bus money".   Case management called at this time, as they just arrived and where prepared to provide bus passes.   No further.

## 2016-11-12 NOTE — Discharge Summary (Signed)
Discharge Summary   Admit Date: 11/10/2016 Discharge Date: 11/12/2016 Discharging Service: Obstetrics  Primary OBGYN: [facility] Admitting Physician: Truett Mainland, DO  Discharge Physician: Durene Romans MD  Referring Provider: Maternity Admissions Unit  Primary Care Provider: No PCP Per Patient  Admission Diagnoses: *POD#5 status post repeat cesarean section *Abdominal Pain with concern for infectious etiology *Anemia  Discharge Diagnoses: *POD#7 status post repeat cesarean section *Abdominal pain (improved_ with concern for infectious etiology *Anemia  Consult Orders: None   Surgeries/Procedures Performed: None  History and Physical: Attestation signed by Truett Mainland, DO at 11/11/2016 6:08 AM  Attestation of Attending Supervision of Advanced Practitioner (PA/CNM/NP): Evaluation and management procedures were performed by the Advanced Practitioner under my supervision and collaboration.  I have reviewed the Advanced Practitioner's note and chart, and I agree with the management and plan.  Loma Boston, DO Attending [facility], [place]       [] Hide copied text  Chief Complaint: Post-op Problem   First Provider Initiated Contact with Patient 11/10/16 1517      SUBJECTIVE HPI: Amber Howard is a 20 y.o. [id number] who is POD #5 following RLTCS who presents to maternity admissions reporting she felt and heard a pop in her low abdomen last night which was followed by an increase in her abdominal pain. She reports her pain has not been well controlled since her C/S but was improved by taking 2 Percocet until this pop and now is untouched by the medications.  Moving at all causes more pain, standing is excruciating.  Even taking deep breaths causes more abdominal pain. The pain is constant and burning, but increases with movement to sharp pain radiating into her low back and down both legs.  She reports her lochia stopped completely 2 days ago then started again today light with small clots and has an unusual odor. She denies vaginal itching/burning, urinary symptoms, h/a, dizziness, n/v, or fever/chills.     HPI      Past Medical History:  Diagnosis Date  . Anemia   . Fibroids   . Migraine         Past Surgical History:  Procedure Laterality Date  . CESAREAN SECTION  2013  . CESAREAN SECTION N/A 11/05/2016   Procedure: CESAREAN SECTION;  Surgeon: Caren Macadam, MD;  Location: [facility];  Service: Obstetrics;  Laterality: N/A;  . NOSE SURGERY  2015   Social History        Social History  . Marital status: Married    Spouse name: N/A  . Number of children: N/A  . Years of education: N/A      Occupational History  . Not on file.        Social History Main Topics  . Smoking status: Current Some Day Smoker    Packs/day: 0.25    Types: Cigarettes    Last attempt to quit: 10/20/2014  . Smokeless tobacco: Never Used  . Alcohol use No  . Drug use: No  . Sexual activity: Yes    Birth control/ protection: None   Other Topics Concern  . Not on file      Social History Narrative  . No narrative on file   No current facility-administered medications on file prior to encounter.          Current Outpatient Prescriptions on File Prior to Encounter  Medication Sig Dispense Refill  . butalbital-acetaminophen-caffeine (FIORICET, ESGIC) 50-325-40 MG tablet Take 1-2 tablets  by mouth every 6 (six) hours as needed for headache. 20 tablet 0        Allergies  Allergen Reactions  . 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 Bee Venom Anaphylaxis  . Onion Anaphylaxis  . Other Anaphylaxis, Hives, Itching and Other (See Comments)    Pt states that she is allergic to fire ants.    . Benadryl [Diphenhydramine] Hives  . [name] Isothiocyanate] Hives  . Orange Fruit [Citrus] Hives    ROS:  Review of Systems    Constitutional: Negative for chills, fatigue and fever.  Respiratory: Negative for shortness of breath.   Cardiovascular: Negative for chest pain.  Gastrointestinal: Positive for abdominal pain.  Genitourinary: Positive for pelvic pain and vaginal bleeding. Negative for difficulty urinating, dysuria, flank pain, vaginal discharge and vaginal pain.  Neurological: Negative for dizziness and headaches.  Psychiatric/Behavioral: Negative.      I have reviewed patient's Past Medical Hx, Surgical Hx, Family Hx, Social Hx, medications and allergies.   Physical Exam   Patient Vitals for the past 24 hrs:  BP Temp Temp src Pulse Resp SpO2  11/10/16 1932 123/69 99.7 F (37.6 C) Axillary 99 18 -  11/10/16 1730 94/63 99.9 F (37.7 C) Oral 94 18 -  11/10/16 1439 117/64 100.3 F (37.9 C) Oral 96 20 100 %   Constitutional: Well-developed, well-nourished female in no acute distress.  HEART: normal rate, heart sounds, regular rhythm RESP: normal effort, lung sounds clear and equal bilaterally GI: Abd soft, significant tenderness below umbilicus and down to incision.  Uterine fundus palpable 1cm below umbilicus. Pos BS x 4 MS: Extremities nontender, no edema, normal ROM Neurologic: Alert and oriented x 4.  GU: Neg CVAT.  PELVIC EXAM: Deferred   LAB RESULTS      Results for orders placed or performed during the hospital encounter of 11/10/16 (from the past 24 hour(s))  CBC     Status: Abnormal   Collection Time: 11/10/16  3:46 PM  Result Value Ref Range   WBC 11.0 (H) 4.0 - 10.5 K/uL   RBC 3.28 (L) 3.87 - 5.11 MIL/uL   Hemoglobin 7.6 (L) 12.0 - 15.0 g/dL   HCT 22.9 (L) 36.0 - 46.0 %   MCV 69.8 (L) 78.0 - 100.0 fL   MCH 23.2 (L) 26.0 - 34.0 pg   MCHC 33.2 30.0 - 36.0 g/dL   RDW 22.2 (H) 11.5 - 15.5 %   Platelets 312 150 - 400 K/uL    --/--/A POS, A POS (11/07 1115)  IMAGING US Pelvis Complete  Result Date: 11/10/2016 CLINICAL DATA:  C-section 11/05/2016.  History of fibroids. Gravida 5 para 3 SAB to. Postpartum. EXAM: TRANSABDOMINAL ULTRASOUND OF PELVIS TECHNIQUE: Transabdominal ultrasound examination of the pelvis was performed including evaluation of the uterus, ovaries, adnexal regions, and pelvic cul-de-sac. COMPARISON:  10/10/2016 FINDINGS: Uterus Measurements: 17.7 x 9.2 x 14.2 cm. No discrete fibroids are identified. Uterus is anteflexed. Endometrium Thickness: Within the lower uterine segment there is a small collection of fluid, likely indicating blood, blood clot, or debris following delivery. The endometrial thickness otherwise appears normal. Right ovary Measurements: The ovary is not visualized, either absent or obscured . No adnexal mass identified. Left ovary Measurements: The ovary is not visualized, either absent or obscured . No adnexal mass identified. Other findings: Within the anterior pelvis, slightly to the left, there is an irregular fluid collection measuring 9.5 by 0 7.0 x 3.6 cm. This collection is between the bladder, the uterus, and anterior abdominal  wall. This raises question of a abscess or hematoma. IMPRESSION: 1. Postpartum uterus. 2. Small area of fluid within the lower uterine segment endometrial canal, likely related to postpartum clot or debris. 3. Irregular anterior pelvic collection suspicious for abscess or hematoma measuring 9.5 x 7.0 x 3.6 cm. As needed, CT may be helpful for further characterization. Electronically Signed   By: Nolon Nations M.D.   On: 11/10/2016 17:30    MAU Management/MDM: Ordered labs and [place] and reviewed results. Dilaudid 1 mg IM given with some improvement in pain.  [place] results indicate pelvic mass, abscess vs hematoma. Consult [facility] with assessment, labs, [place] results.    CT scan ordered.  IV started and Dilaudid 1 mg IV given.  Report to Marcille Buffy, CNM.    Fatima Blank Certified Nurse-Midwife 11/10/2016  8:15 PM  2235: D/W Dr. Nehemiah Settle. Will place in obs for pain  management.   Assessment/Plan: Postpartum C-section postoperative hematoma  Admit to 3rd floor Augmentin BID Dilaudid for pain control  Mathis Bud  10:44 PM 11/10/16      Hospital Course: *PP: routine PP care *Pain: patient was on oxycodone, but pain control not felt to be adequate so patient on IV dilaudid for BT pain and then transitioned to PO dilaudid only, which helped with her pain, but  pt wanted to switch to oxycodone and would like to go home which is fine given her pain is stable. Will d/c home on oxycodone 10/325. *ID: continue augmentin on discharge; she was on day#3 on discharge. D/c home with 7d course for ?infected hematoma vs endometritis although both are unlikely but given non specific low belly discomfort, will do complete 7d course. Last temp was 38.3 on 11/13 @ 2300. No cultures were done at this time.  *Anemia: continue with iron bid  *FEN/GI: regular diet, BM regimen (will add senna and miralax) *PPx: SCDs, OOB with help  Discharge Exam:  11/12/16 0600 (!) 110/54 98.9 F (37.2 C) Oral 83 18 99 %  11/11/16 2148 119/60 99.2 F (37.3 C) Oral 93 18 100 %  11/11/16 1723 119/63 99.8 F (37.7 C) Oral 85 18 100 %  11/11/16 1400 117/68 99.8 F (37.7 C) Oral 88 18 98 %  11/11/16 1000 (!) 106/59 98.9 F (37.2 C) Oral 87 18 97 %    General appearance: alert, cooperative and appears stated age Abdomen: +BS, soft, nttp. Incision c/d/i and no e/o infection. 2-3 subcm slight superificial skin separation but nothing deep and non weeping, non tender GU: No gross VB Lungs: clear to auscultation bilaterally Heart: S1, S2 normal, no murmur, rub or gallop, regular rate and rhythm Extremities: no c/c/e. SCDs on Skin: warm and dry Psych: appropriate  Discharge Disposition:  Home  Patient Instructions:  Standard   Results Pending at Discharge:  None   Discharge Medications:   Medication List    TAKE these medications     butalbital-acetaminophen-caffeine 50-325-40 MG tablet Commonly known as:  FIORICET, ESGIC Take 1-2 tablets by mouth every 6 (six) hours as needed for headache.   ferrous gluconate 324 MG tablet Commonly known as:  FERGON Take 1 tablet (324 mg total) by mouth 2 (two) times daily with a meal.   Oxycodone HCl 10 MG Tabs Take 1 tablet (10 mg total) by mouth every 6 (six) hours as needed.   oxyCODONE-acetaminophen 5-325 MG tablet Commonly known as:  PERCOCET/ROXICET Take 1 tablet by mouth every 4 (four) hours as needed (for pain scale 4-7).  polyethylene glycol packet Commonly known as:  MIRALAX / GLYCOLAX Take 17 g by mouth daily.   senna 8.6 MG Tabs tablet Commonly known as:  SENOKOT Take 1 tablet (8.6 mg total) by mouth daily as needed for mild constipation.     ASK your doctor about these medications   amoxicillin-clavulanate 875-125 MG tablet Commonly known as:  AUGMENTIN Take 1 tablet by mouth 2 (two) times daily. Ask about: Should I take this medication?      Future Appointments Date Time Provider [place]  12/08/2016 2:40 PM Seabron Spates, CNM [facility]    Durene Romans MD Attending Center for [facility])

## 2016-11-12 NOTE — Progress Notes (Signed)
Patient is demanding a bus pass for her spouse.  She asked for the charge nurse and/or supervisor.  The house supervisor informed this nurse that the case management would have to discuss this with the patient and that they would be in at 0830.  No further.

## 2016-11-12 NOTE — Progress Notes (Signed)
Postpartum Progress Note  Admission Date: 11/10/2016 Current Date: 11/12/2016 8:02 AM  Amber Howard is a 20 y.o. [id number] HD#3 admitted for abdominal pain, ?infected hematoma/POD#7 s/p scheduled repeat c-section  History complicated by GBS positive, tobacco abuse, pelvic adhesive disease, post op anemia  ROS and patient/family/surgical history, located on admission H&P note dated 11/10/2016, have been reviewed, and there are no changes except as noted below Yesterday/Overnight Events:  None  Subjective:  No fevers, chills, chest pain, SOB, nausea, vomiting. +PO intake w/o issue, ambulation and BMs. Pain stable with the PO meds. Pt self d/c'ed lidoderm patch b/c it made her feel "tingley" and would rather do the percocet than dilaudid.   Objective:    Current Vital Signs 24h Vital Sign Ranges  T 98.9 F (37.2 C) Temp  Avg: 99.3 F (37.4 C)  Min: 98.9 F (37.2 C)  Max: 99.8 F (37.7 C)  BP (!) 110/54 BP  Min: 106/59  Max: 119/60  HR 83 Pulse  Avg: 87.2  Min: 83  Max: 93  RR 18 Resp  Avg: 18  Min: 18  Max: 18  SaO2 99 % Not Delivered SpO2  Avg: 98.8 %  Min: 97 %  Max: 100 %       24 Hour I/O Current Shift I/O  Time Ins Outs 11/14 0701 - 11/15 0700 In: 2160 [P.O.:2160] Out: 2350 [Urine:2350] No intake/output data recorded.   Patient Vitals for the past 24 hrs:  BP Temp Temp src Pulse Resp SpO2  11/12/16 0600 (!) 110/54 98.9 F (37.2 C) Oral 83 18 99 %  11/11/16 2148 119/60 99.2 F (37.3 C) Oral 93 18 100 %  11/11/16 1723 119/63 99.8 F (37.7 C) Oral 85 18 100 %  11/11/16 1400 117/68 99.8 F (37.7 C) Oral 88 18 98 %  11/11/16 1000 (!) 106/59 98.9 F (37.2 C) Oral 87 18 97 %   Last temp: 38.8 on 11/13 @ 2300 Physical exam: General appearance: alert, cooperative and appears stated age Abdomen: +BS, soft, nttp. Incision c/d/i and no e/o infection. 2-3 subcm slight superificial skin separation but nothing deep and non weeping, non tender GU: No gross VB Lungs:  clear to auscultation bilaterally Heart: S1, S2 normal, no murmur, rub or gallop, regular rate and rhythm Extremities: no c/c/e. SCDs on Skin: warm and dry Psych: appropriate Neurologic: Grossly normal  Medications Current Facility-Administered Medications  Medication Dose Route Frequency Provider Last Rate Last Dose  . acetaminophen (TYLENOL) tablet 650 mg  650 mg Oral Q4H PRN Tresea Mall, CNM   650 mg at 11/12/16 0448  . amoxicillin-clavulanate (AUGMENTIN) 875-125 MG per tablet 1 tablet  1 tablet Oral BID Tresea Mall, CNM   1 tablet at 11/11/16 2229  . calcium carbonate (TUMS - dosed in mg elemental calcium) chewable tablet 400 mg of elemental calcium  2 tablet Oral Q4H PRN Tresea Mall, CNM      . cyclobenzaprine (FLEXERIL) tablet 5 mg  5 mg Oral TID PRN Aletha Halim, MD      . ferrous gluconate (FERGON) tablet 324 mg  324 mg Oral BID WC Aletha Halim, MD   324 mg at 11/11/16 1720  . HYDROmorphone (DILAUDID) injection 1.5 mg  1.5 mg Intramuscular Once Aletha Halim, MD      . HYDROmorphone (DILAUDID) tablet 2-4 mg  2-4 mg Oral Q6H PRN Aletha Halim, MD   4 mg at 11/12/16 0123  . lidocaine (LIDODERM) 5 % 1 patch  1 patch  Transdermal Q24H Aletha Halim, MD   1 patch at 11/11/16 1229  . polyethylene glycol (MIRALAX / GLYCOLAX) packet 17 g  17 g Oral Daily Aletha Halim, MD   17 g at 11/11/16 1515  . senna (SENOKOT) tablet 8.6 mg  1 tablet Oral Daily Aletha Halim, MD   8.6 mg at 11/11/16 1227  . zolpidem (AMBIEN) tablet 5 mg  5 mg Oral QHS PRN Tresea Mall, CNM   5 mg at 11/10/16 2320   Labs   Recent Labs Lab 11/10/16 1546 11/11/16 0711 11/12/16 0514  WBC 11.0* 10.1 11.3*  HGB 7.6* 7.1* 7.1*  HCT 22.9* 22.3* 22.0*  PLT 312 310 346   Pending: none  Radiology No new imaging  Assessment & Plan:  Pt stable *PP: routine PP care *Pain: pt would like to switch to oxycodone and would like to go home which is fine given her pain is stable. Will d/c home  on oxycodone *ID: continue augmentin D#3. D/c home with 7d course for ?infected hematoma vs endometritis although both are unlikely but given non specific low belly discomfort, will do complete 7d course.  *Anemia: continue with iron bid  *FEN/GI: regular diet, BM regimen (will add senna and miralax) *PPx: SCDs, OOB with help *Dispo: later today.   Code Status: Full Code  Total time taking care of the patient was 15 minutes, with greater than 50% of the time spent in face to face interaction with the patient.  Durene Romans MD Attending Center for [facility])

## 2016-12-08 ENCOUNTER — Ambulatory Visit: Payer: Medicaid Other | Admitting: Advanced Practice Midwife

## 2016-12-09 ENCOUNTER — Ambulatory Visit: Payer: Medicaid Other | Admitting: Family

## 2017-01-07 ENCOUNTER — Emergency Department (HOSPITAL_COMMUNITY): Payer: Medicaid Other

## 2017-01-07 ENCOUNTER — Emergency Department (HOSPITAL_COMMUNITY)
Admission: EM | Admit: 2017-01-07 | Discharge: 2017-01-07 | Disposition: A | Discharge status: Home / Self Care | Payer: Medicaid Other | Attending: Emergency Medicine | Admitting: Emergency Medicine

## 2017-01-07 ENCOUNTER — Encounter (HOSPITAL_COMMUNITY): Payer: Self-pay | Admitting: Emergency Medicine

## 2017-01-07 DIAGNOSIS — Y999 Unspecified external cause status: Secondary | ICD-10-CM | POA: Diagnosis not present

## 2017-01-07 DIAGNOSIS — S99922A Unspecified injury of left foot, initial encounter: Secondary | ICD-10-CM | POA: Diagnosis present

## 2017-01-07 DIAGNOSIS — Y92009 Unspecified place in unspecified non-institutional (private) residence as the place of occurrence of the external cause: Secondary | ICD-10-CM | POA: Diagnosis not present

## 2017-01-07 DIAGNOSIS — Y939 Activity, unspecified: Secondary | ICD-10-CM | POA: Diagnosis not present

## 2017-01-07 DIAGNOSIS — R51 Headache: Secondary | ICD-10-CM | POA: Diagnosis not present

## 2017-01-07 DIAGNOSIS — M79674 Pain in right toe(s): Secondary | ICD-10-CM

## 2017-01-07 DIAGNOSIS — M545 Low back pain, unspecified: Secondary | ICD-10-CM

## 2017-01-07 DIAGNOSIS — M79671 Pain in right foot: Secondary | ICD-10-CM | POA: Insufficient documentation

## 2017-01-07 DIAGNOSIS — F17210 Nicotine dependence, cigarettes, uncomplicated: Secondary | ICD-10-CM | POA: Diagnosis not present

## 2017-01-07 DIAGNOSIS — S90122A Contusion of left lesser toe(s) without damage to nail, initial encounter: Secondary | ICD-10-CM | POA: Insufficient documentation

## 2017-01-07 DIAGNOSIS — R519 Headache, unspecified: Secondary | ICD-10-CM

## 2017-01-07 LAB — PREGNANCY, URINE: Preg Test, Ur: NEGATIVE

## 2017-01-07 MED ORDER — BUTALBITAL-APAP-CAFFEINE 50-325-40 MG PO TABS: 1.0000 | ORAL_TABLET | Freq: Four times a day (QID) | ORAL | 0 refills | Status: DC | PRN

## 2017-01-07 MED ORDER — ACETAMINOPHEN 325 MG PO TABS
650.0000 mg | ORAL_TABLET | Freq: Once | ORAL | Status: AC
  Administered 2017-01-07: 650 mg via ORAL
  Filled 2017-01-07: qty 2

## 2017-01-07 NOTE — Discharge Instructions (Signed)
Please read attached information. If you experience any new or worsening signs or symptoms please return to the emergency room for evaluation. Please follow-up with your primary care provider or specialist as discussed. Please use medication prescribed only as directed and discontinue taking if you have any concerning signs or symptoms.   °
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
## 2017-01-07 NOTE — ED Notes (Signed)
Pt transported to radiology.
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
## 2017-06-04 ENCOUNTER — Encounter (HOSPITAL_COMMUNITY): Payer: Self-pay | Admitting: Emergency Medicine

## 2017-06-04 ENCOUNTER — Emergency Department (HOSPITAL_COMMUNITY)
Admission: EM | Admit: 2017-06-04 | Discharge: 2017-06-05 | Disposition: A | Discharge status: Home / Self Care | Payer: Medicaid Other | Attending: Emergency Medicine | Admitting: Emergency Medicine

## 2017-06-04 ENCOUNTER — Emergency Department (HOSPITAL_COMMUNITY): Payer: Medicaid Other

## 2017-06-04 DIAGNOSIS — N949 Unspecified condition associated with female genital organs and menstrual cycle: Secondary | ICD-10-CM | POA: Diagnosis not present

## 2017-06-04 DIAGNOSIS — F17210 Nicotine dependence, cigarettes, uncomplicated: Secondary | ICD-10-CM | POA: Insufficient documentation

## 2017-06-04 DIAGNOSIS — Z3A00 Weeks of gestation of pregnancy not specified: Secondary | ICD-10-CM | POA: Insufficient documentation

## 2017-06-04 DIAGNOSIS — O26899 Other specified pregnancy related conditions, unspecified trimester: Secondary | ICD-10-CM

## 2017-06-04 DIAGNOSIS — O30002 Twin pregnancy, unspecified number of placenta and unspecified number of amniotic sacs, second trimester: Secondary | ICD-10-CM | POA: Insufficient documentation

## 2017-06-04 DIAGNOSIS — O26892 Other specified pregnancy related conditions, second trimester: Secondary | ICD-10-CM

## 2017-06-04 DIAGNOSIS — R109 Unspecified abdominal pain: Secondary | ICD-10-CM | POA: Insufficient documentation

## 2017-06-04 DIAGNOSIS — M5431 Sciatica, right side: Secondary | ICD-10-CM | POA: Insufficient documentation

## 2017-06-04 DIAGNOSIS — R1084 Generalized abdominal pain: Secondary | ICD-10-CM | POA: Diagnosis present

## 2017-06-04 HISTORY — DX: Complete or unspecified spontaneous abortion without complication: O03.9

## 2017-06-04 LAB — CBC WITH DIFFERENTIAL/PLATELET
Basophils Absolute: 0 10*3/uL (ref 0.0–0.1)
Basophils Relative: 0 %
EOS ABS: 0 10*3/uL (ref 0.0–0.7)
EOS PCT: 0 %
HCT: 31.3 % — ABNORMAL LOW (ref 36.0–46.0)
Hemoglobin: 10 g/dL — ABNORMAL LOW (ref 12.0–15.0)
LYMPHS ABS: 2.4 10*3/uL (ref 0.7–4.0)
Lymphocytes Relative: 30 %
MCH: 23 pg — AB (ref 26.0–34.0)
MCHC: 31.9 g/dL (ref 30.0–36.0)
MCV: 72 fL — ABNORMAL LOW (ref 78.0–100.0)
MONOS PCT: 5 %
Monocytes Absolute: 0.4 10*3/uL (ref 0.1–1.0)
Neutro Abs: 5.2 10*3/uL (ref 1.7–7.7)
Neutrophils Relative %: 65 %
PLATELETS: 243 10*3/uL (ref 150–400)
RBC: 4.35 MIL/uL (ref 3.87–5.11)
RDW: 18 % — ABNORMAL HIGH (ref 11.5–15.5)
WBC: 7.9 10*3/uL (ref 4.0–10.5)

## 2017-06-04 LAB — COMPREHENSIVE METABOLIC PANEL
ALK PHOS: 39 U/L (ref 38–126)
ALT: 10 U/L — ABNORMAL LOW (ref 14–54)
ANION GAP: 7 (ref 5–15)
AST: 15 U/L (ref 15–41)
Albumin: 2.8 g/dL — ABNORMAL LOW (ref 3.5–5.0)
BUN: 8 mg/dL (ref 6–20)
CHLORIDE: 106 mmol/L (ref 101–111)
CO2: 22 mmol/L (ref 22–32)
CREATININE: 0.47 mg/dL (ref 0.44–1.00)
Calcium: 8.8 mg/dL — ABNORMAL LOW (ref 8.9–10.3)
GLUCOSE: 82 mg/dL (ref 65–99)
Potassium: 3.6 mmol/L (ref 3.5–5.1)
SODIUM: 135 mmol/L (ref 135–145)
TOTAL PROTEIN: 6.2 g/dL — AB (ref 6.5–8.1)
Total Bilirubin: 0.2 mg/dL — ABNORMAL LOW (ref 0.3–1.2)

## 2017-06-04 LAB — URINALYSIS, ROUTINE W REFLEX MICROSCOPIC
BILIRUBIN URINE: NEGATIVE
Glucose, UA: NEGATIVE mg/dL
HGB URINE DIPSTICK: NEGATIVE
KETONES UR: NEGATIVE mg/dL
Nitrite: POSITIVE — AB
PROTEIN: 30 mg/dL — AB
Specific Gravity, Urine: 1.024 (ref 1.005–1.030)
pH: 7 (ref 5.0–8.0)

## 2017-06-04 LAB — WET PREP, GENITAL
Sperm: NONE SEEN
TRICH WET PREP: NONE SEEN
Yeast Wet Prep HPF POC: NONE SEEN

## 2017-06-04 LAB — POC URINE PREG, ED: Preg Test, Ur: POSITIVE — AB

## 2017-06-04 NOTE — ED Notes (Signed)
Patient transported to Ultrasound 

## 2017-06-04 NOTE — ED Provider Notes (Signed)
[facility] DEPT Provider Note   CSN: [id number] Arrival date & time: 06/04/17  1736  By signing my name below, I, [name], attest that this documentation has been prepared under the direction and in the presence of [place]. Janit Bern, NP. Electronically Signed: [name], Scribe. 06/04/2017. 8:11 PM.  History   Chief Complaint Chief Complaint  Patient presents with  . Abdominal Cramping   The history is provided by the patient. No language interpreter was used.  Abdominal Cramping  This is a new problem. The current episode started yesterday. The problem occurs hourly. The problem has not changed since onset.Associated symptoms include abdominal pain. Pertinent negatives include no chest pain, no headaches and no shortness of breath. Nothing aggravates the symptoms. Nothing relieves the symptoms. She has tried nothing for the symptoms.    HPI Comments: Amber Howard is a 21 y.o. female who presents to the Emergency Department complaining of intermittent lower abdominal cramping beginning yesterday. She reports some associated nausea without vomiting and urinary frequency. Patient is concerned for pregnancy; she missed her most recently scheduled menstrual period and her LNMP was on 02/06/17. Patient had a few days of vaginal spotting this past March and this past May without any bleeding in April. She has not taken any OTC pregnancy tests. She has a h/o four prior pregnancies with one miscarriage and one set of twins. All of her deliveries were via C-section. Patient's youngest child is 56 old and she has not used any contraceptives since this birth. No h/o STD's. She has been with her current sexual partner since 28. She denies vaginal discharge, dysuria, or any other associated symptoms.  Past Medical History:  Diagnosis Date  . Anemia   . Fibroids   . Migraine   . Miscarriage     Patient Active Problem List   Diagnosis Date Noted  . Hematoma 11/10/2016  .  S/P repeat low transverse C-section 11/05/2016  . 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 Indication for care in labor and delivery, antepartum 10/17/2016  . Group B Streptococcus carrier, +RV culture, currently pregnant 10/13/2016  . Previous preterm delivery of twins due to IUGR, antepartum 06/19/2016  . Obesity in pregnancy, antepartum 06/19/2016  . Previous cesarean delivery x 2 affecting pregnancy, antepartum 10/24/2014  . Supervision of normal pregnancy in third trimester 10/24/2014    Past Surgical History:  Procedure Laterality Date  . CESAREAN SECTION  2013  . CESAREAN SECTION N/A 11/05/2016   Procedure: CESAREAN SECTION;  Surgeon: Caren Macadam, MD;  Location: [employer];  Service: Obstetrics;  Laterality: N/A;  . NOSE SURGERY  2015    OB History    Gravida Para Term Preterm AB Living   5 3 2 1 2 4    SAB TAB Ectopic Multiple Live Births   2     1 4        Home Medications    Prior to Admission medications   Medication Sig Start Date End Date Taking? Authorizing Provider  senna (SENOKOT) 8.6 MG TABS tablet Take 1 tablet (8.6 mg total) by mouth daily as needed for mild constipation. Patient not taking: Reported on 06/04/2017 11/12/16   Aletha Halim, MD    Family History Family History  Problem Relation Age of Onset  . Anemia Mother   . Fibroids Mother     Social History Social History  Substance Use Topics  . Smoking status: Current Some Day Smoker    Packs/day: 0.25    Types: Cigarettes  Last attempt to quit: 10/20/2014  . Smokeless tobacco: Never Used  . Alcohol use No     Allergies   Asa [aspirin]; Bee venom; Onion; Other; Wasp venom protein; Benadryl [diphenhydramine]; [name] isothiocyanate]; and Orange fruit [citrus]   Review of Systems Review of Systems  Constitutional: Positive for fatigue. Negative for activity change and diaphoresis.  HENT: Negative.   Respiratory: Negative for shortness of breath.     Cardiovascular: Negative for chest pain.  Gastrointestinal: Positive for abdominal pain and nausea. Negative for vomiting.  Genitourinary: Positive for frequency. Negative for dysuria, hematuria, vaginal bleeding, vaginal discharge and vaginal pain.  Musculoskeletal: Negative for back pain.  Skin: Negative for rash.  Neurological: Negative for syncope and headaches.  Psychiatric/Behavioral: The patient is not nervous/anxious.    Physical Exam Updated Vital Signs BP 115/66   Pulse 71   Temp 98.6 F (37 C)   Resp 18   Ht 5\' 2"  (1.575 m)   Wt 83.9 kg (185 lb)   LMP 04/15/2017   SpO2 98%   BMI 33.84 kg/m   Physical Exam  Constitutional: She is oriented to person, place, and time. She appears well-developed and well-nourished. No distress.  HENT:  Head: Normocephalic and atraumatic.  Eyes: Conjunctivae and EOM are normal.  Neck: Neck supple. No tracheal deviation present.  Cardiovascular: Normal rate.   Pulmonary/Chest: Effort normal. No respiratory distress.  Abdominal: Bowel sounds are normal. There is tenderness. There is no rebound and no guarding.  Lower abdominal and suprapubic tenderness. Gravid approximately 16 week by fundal ht.   Genitourinary:  Genitourinary Comments: External genitalia without lesions, white d/c vaginal vault, no CMT, cervix closed. Uterus approximately 16 week size.   Musculoskeletal: Normal range of motion.  Tender with palpation over right sciatic nerve.   Neurological: She is alert and oriented to person, place, and time.  Skin: Skin is warm and dry.  Psychiatric: She has a normal mood and affect. Her behavior is normal.  Nursing note and vitals reviewed.  Informal bedside ultrasound by me, appears to be twin pregnancy. Will order formal ultrasound.   ED Treatments / Results  Labs (all labs ordered are listed, but only abnormal results are displayed) Labs Reviewed  WET PREP, GENITAL - Abnormal; Notable for the following:       Result Value    Clue Cells Wet Prep HPF POC PRESENT (*)    WBC, Wet Prep HPF POC FEW (*)    All other components within normal limits  URINALYSIS, ROUTINE W REFLEX MICROSCOPIC - Abnormal; Notable for the following:    APPearance CLOUDY (*)    Protein, ur 30 (*)    Nitrite POSITIVE (*)    Leukocytes, UA LARGE (*)    Bacteria, UA FEW (*)    Squamous Epithelial / LPF 6-30 (*)    All other components within normal limits  CBC WITH DIFFERENTIAL/PLATELET - Abnormal; Notable for the following:    Hemoglobin 10.0 (*)    HCT 31.3 (*)    MCV 72.0 (*)    MCH 23.0 (*)    RDW 18.0 (*)    All other components within normal limits  COMPREHENSIVE METABOLIC PANEL - Abnormal; Notable for the following:    Calcium 8.8 (*)    Total Protein 6.2 (*)    Albumin 2.8 (*)    ALT 10 (*)    Total Bilirubin 0.2 (*)    All other components within normal limits  POC URINE PREG, ED - Abnormal; Notable  for the following:    Preg Test, Ur POSITIVE (*)    All other components within normal limits  URINE CULTURE  RPR  HIV ANTIBODY (ROUTINE TESTING)  GC/CHLAMYDIA PROBE AMP ([place]) NOT AT [facility]   Radiology US Ob Limited  Result Date: 06/04/2017 CLINICAL DATA:  Pregnant patient in second trimester pregnancy with abdominal pain. EXAM: LIMITED OBSTETRIC ULTRASOUND FINDINGS: Number of Fetuses:  2 Separating Membrane: Visualized TWIN 1 Heart Rate:  149 bpm Movement: Yes Presentation: Transverse head maternal left. Placental Location: Posterior right lateral. Previa: No Amniotic Fluid (Subjective):  Within normal limits. BPD:  2.68cm 14w  5d TWIN 2 Heart Rate:  144 bpm Movement: Yes Presentation: Transverse head maternal right. Placental Location: Posterior right lateral. Previa: No. Amniotic Fluid (Subjective): Within normal limits. BPD:  2.61cm 14w  4d MATERNAL FINDINGS: Cervix:  Appears closed. Uterus/Adnexae: Probable anterior fibroid measuring 2.5 x 2.3 x 3.1 cm. IMPRESSION: Live intrauterine twin pregnancy estimated  gestational age [redacted] weeks 5 days and 14 weeks 4 day respectively. This appears to be a monochorionic diamniotic twin pregnancy with 2 gestational sacs and probable single placenta. This exam is performed on an emergent basis and does not comprehensively evaluate fetal size, dating, or anatomy; follow-up complete OB US should be considered if further fetal assessment is warranted Electronically Signed   By: Jeb Levering M.D.   On: 06/04/2017 23:41   US Ob Limited  Result Date: 06/04/2017 CLINICAL DATA:  Pregnant patient in second trimester pregnancy with abdominal pain. EXAM: LIMITED OBSTETRIC ULTRASOUND FINDINGS: Number of Fetuses:  2 Separating Membrane: Visualized TWIN 1 Heart Rate:  149 bpm Movement: Yes Presentation: Transverse head maternal left. Placental Location: Posterior right lateral. Previa: No Amniotic Fluid (Subjective):  Within normal limits. BPD:  2.68cm 14w  5d TWIN 2 Heart Rate:  144 bpm Movement: Yes Presentation: Transverse head maternal right. Placental Location: Posterior right lateral. Previa: No. Amniotic Fluid (Subjective): Within normal limits. BPD:  2.61cm 14w  4d MATERNAL FINDINGS: Cervix:  Appears closed. Uterus/Adnexae: Probable anterior fibroid measuring 2.5 x 2.3 x 3.1 cm. IMPRESSION: Live intrauterine twin pregnancy estimated gestational age [redacted] weeks 5 days and 14 weeks 4 day respectively. This appears to be a monochorionic diamniotic twin pregnancy with 2 gestational sacs and probable single placenta. This exam is performed on an emergent basis and does not comprehensively evaluate fetal size, dating, or anatomy; follow-up complete OB US should be considered if further fetal assessment is warranted Electronically Signed   By: Jeb Levering M.D.   On: 06/04/2017 23:41    Procedures Procedures (including critical care time)  DIAGNOSTIC STUDIES: Oxygen Saturation is 99% on RA, normal by my interpretation.    COORDINATION OF CARE: 8:10 PM Discussed treatment plan with  pt at bedside and pt agreed to plan.  Medications Ordered in ED Medications - No data to display   Initial Impression / Assessment and Plan / ED Course  I have reviewed the triage vital signs and the nursing notes.  Pertinent labs & imaging results that were available during my care of the patient were reviewed by me and considered in my medical decision making (see chart for details).  Patient without dysuria or UTI symptoms. Urine sent for culture. GC, chlamydia cultures also sent.   Final Clinical Impressions(s) / ED Diagnoses  21 y.o. female with abdominal pain during pregnancy stable for d/c without fever, back pain or acute abdomen. Discussed with the patient twin pregnancy and starting prenatal care. Urine sent for  culture and will call patient if she needs antibiotics. Patient to take tylenol as needed for sciatica and round ligament pain.   Final diagnoses:  Abdominal pain during pregnancy in second trimester  Sciatica, right side  Round ligament pain  Twin gestation in second trimester, unspecified multiple gestation type    New Prescriptions Discharge Medication List as of 06/05/2017 12:24 AM     I personally performed the services described in this documentation, which was scribed in my presence. The recorded information has been reviewed and is accurate.    Debroah Baller [place], [place] 06/05/17 [place]    Duffy Bruce, MD 06/05/17 [id number]

## 2017-06-04 NOTE — ED Triage Notes (Signed)
Pt st's she thinks she is pregnant but has not had a preg test.  Pt c/o lower abd cramping x's 2 days, denies any vag. bleeding

## 2017-06-05 LAB — GC/CHLAMYDIA PROBE AMP (~~LOC~~) NOT AT ARMC
Chlamydia: NEGATIVE
NEISSERIA GONORRHEA: NEGATIVE

## 2017-06-05 LAB — RPR: RPR Ser Ql: NONREACTIVE

## 2017-06-05 LAB — HIV ANTIBODY (ROUTINE TESTING W REFLEX): HIV Screen 4th Generation wRfx: NONREACTIVE

## 2017-06-05 NOTE — Discharge Instructions (Signed)
Start your prenatal care and prenatal vitamins. If you have problems go to [facility].

## 2017-06-07 LAB — URINE CULTURE: Culture: >=100000 — AB

## 2017-06-08 ENCOUNTER — Telehealth: Payer: Self-pay | Admitting: Emergency Medicine

## 2017-06-08 NOTE — Telephone Encounter (Signed)
Post ED Visit - Positive Culture Follow-up: Successful Patient Follow-Up  Culture assessed and recommendations reviewed by: []  Elenor Quinones, Pharm.D. []  Heide Guile, Pharm.D., BCPS AQ-ID []  Parks Neptune, Pharm.D., BCPS [x]  Alycia Rossetti, Pharm.D., BCPS []  [place], Pharm.D., BCPS, AAHIVP []  Legrand Como, Pharm.D., BCPS, AAHIVP []  Salome Arnt, PharmD, BCPS []  Dimitri Ped, PharmD, BCPS []  Vincenza Hews, PharmD, BCPS  Positive urine culture  [x]  Patient discharged without antimicrobial prescription and treatment is now indicated []  Organism is resistant to prescribed ED discharge antimicrobial []  Patient with positive blood cultures  Changes discussed with ED provider: Shary Decamp PA New antibiotic prescription start Macrobid 100mg  po bid x 7 days  Attempting to contact patient   Hazle Nordmann 06/08/2017, 1:37 PM

## 2017-06-08 NOTE — Progress Notes (Signed)
ED Antimicrobial Stewardship Positive Culture Follow Up   Amber Howard is an 21 y.o. female who presented to [facility] on 06/04/2017 with a chief complaint of  Chief Complaint  Patient presents with  . Abdominal Cramping    Recent Results (from the past 720 hour(s))  Urine culture     Status: Abnormal   Collection Time: 06/04/17  5:55 PM  Result Value Ref Range Status   Specimen Description URINE, RANDOM  Final   Special Requests NONE  Final   Culture >=100,000 COLONIES/mL ESCHERICHIA COLI (A)  Final   Report Status 06/07/2017 FINAL  Final   Organism ID, Bacteria ESCHERICHIA COLI (A)  Final      Susceptibility   Escherichia coli - MIC*    AMPICILLIN >=32 RESISTANT Resistant     CEFAZOLIN <=4 SENSITIVE Sensitive     CEFTRIAXONE <=1 SENSITIVE Sensitive     CIPROFLOXACIN <=0.25 SENSITIVE Sensitive     GENTAMICIN <=1 SENSITIVE Sensitive     IMIPENEM <=0.25 SENSITIVE Sensitive     NITROFURANTOIN <=16 SENSITIVE Sensitive     TRIMETH/SULFA <=20 SENSITIVE Sensitive     AMPICILLIN/SULBACTAM 16 INTERMEDIATE Intermediate     PIP/TAZO <=4 SENSITIVE Sensitive     Extended ESBL NEGATIVE Sensitive     * >=100,000 COLONIES/mL ESCHERICHIA COLI  Wet prep, genital     Status: Abnormal   Collection Time: 06/04/17  9:13 PM  Result Value Ref Range Status   Yeast Wet Prep HPF POC NONE SEEN NONE SEEN Final   Trich, Wet Prep NONE SEEN NONE SEEN Final   Clue Cells Wet Prep HPF POC PRESENT (A) NONE SEEN Final   WBC, Wet Prep HPF POC FEW (A) NONE SEEN Final   Sperm NONE SEEN  Final    [x]  Patient discharged originally without antimicrobial agent and treatment is now indicated  6 YOF found to be pregnant with twins and with an E.coli UTI - requiring treatment.  New antibiotic prescription: Macrobid 100 mg twice daily for 7 days. Follow-up with OBGYN.  ED Provider: Shary Decamp, PA-C  Lawson Radar 06/08/2017, 10:09 AM Infectious Diseases Pharmacist Phone# [phone]

## 2017-06-10 ENCOUNTER — Encounter (HOSPITAL_COMMUNITY): Payer: Self-pay

## 2017-06-10 ENCOUNTER — Inpatient Hospital Stay (HOSPITAL_COMMUNITY)
Admission: AD | Admit: 2017-06-10 | Discharge: 2017-06-11 | Disposition: A | Discharge status: Home / Self Care | Payer: Medicaid Other | Source: Ambulatory Visit | Attending: Obstetrics & Gynecology | Admitting: Obstetrics & Gynecology

## 2017-06-10 DIAGNOSIS — O9989 Other specified diseases and conditions complicating pregnancy, childbirth and the puerperium: Secondary | ICD-10-CM | POA: Diagnosis not present

## 2017-06-10 DIAGNOSIS — F17210 Nicotine dependence, cigarettes, uncomplicated: Secondary | ICD-10-CM | POA: Insufficient documentation

## 2017-06-10 DIAGNOSIS — O26892 Other specified pregnancy related conditions, second trimester: Secondary | ICD-10-CM | POA: Diagnosis present

## 2017-06-10 DIAGNOSIS — Z3492 Encounter for supervision of normal pregnancy, unspecified, second trimester: Secondary | ICD-10-CM

## 2017-06-10 DIAGNOSIS — Z3A15 15 weeks gestation of pregnancy: Secondary | ICD-10-CM | POA: Diagnosis not present

## 2017-06-10 DIAGNOSIS — O09892 Supervision of other high risk pregnancies, second trimester: Secondary | ICD-10-CM | POA: Diagnosis not present

## 2017-06-10 DIAGNOSIS — R51 Headache: Secondary | ICD-10-CM | POA: Insufficient documentation

## 2017-06-10 DIAGNOSIS — O30002 Twin pregnancy, unspecified number of placenta and unspecified number of amniotic sacs, second trimester: Secondary | ICD-10-CM | POA: Insufficient documentation

## 2017-06-10 DIAGNOSIS — O2342 Unspecified infection of urinary tract in pregnancy, second trimester: Secondary | ICD-10-CM | POA: Insufficient documentation

## 2017-06-10 DIAGNOSIS — B9620 Unspecified Escherichia coli [E. coli] as the cause of diseases classified elsewhere: Secondary | ICD-10-CM | POA: Diagnosis not present

## 2017-06-10 DIAGNOSIS — R519 Headache, unspecified: Secondary | ICD-10-CM

## 2017-06-10 DIAGNOSIS — O99332 Smoking (tobacco) complicating pregnancy, second trimester: Secondary | ICD-10-CM | POA: Diagnosis not present

## 2017-06-10 DIAGNOSIS — N390 Urinary tract infection, site not specified: Secondary | ICD-10-CM

## 2017-06-10 LAB — CBC WITH DIFFERENTIAL/PLATELET
BASOS ABS: 0 10*3/uL (ref 0.0–0.1)
BASOS PCT: 0 %
EOS PCT: 0 %
Eosinophils Absolute: 0 10*3/uL (ref 0.0–0.7)
HCT: 25.9 % — ABNORMAL LOW (ref 36.0–46.0)
Hemoglobin: 8.9 g/dL — ABNORMAL LOW (ref 12.0–15.0)
LYMPHS PCT: 18 %
Lymphs Abs: 1.5 10*3/uL (ref 0.7–4.0)
MCH: 23.8 pg — ABNORMAL LOW (ref 26.0–34.0)
MCHC: 34.4 g/dL (ref 30.0–36.0)
MCV: 69.3 fL — AB (ref 78.0–100.0)
MONO ABS: 0.6 10*3/uL (ref 0.1–1.0)
Monocytes Relative: 7 %
Neutro Abs: 6.3 10*3/uL (ref 1.7–7.7)
Neutrophils Relative %: 75 %
Platelets: 206 10*3/uL (ref 150–400)
RBC: 3.74 MIL/uL — ABNORMAL LOW (ref 3.87–5.11)
RDW: 16.9 % — AB (ref 11.5–15.5)
WBC: 8.4 10*3/uL (ref 4.0–10.5)

## 2017-06-10 MED ORDER — DEXAMETHASONE SODIUM PHOSPHATE 10 MG/ML IJ SOLN
10.0000 mg | Freq: Once | INTRAMUSCULAR | Status: AC
  Administered 2017-06-10: 10 mg via INTRAVENOUS
  Filled 2017-06-10: qty 1

## 2017-06-10 MED ORDER — ACETAMINOPHEN 160 MG/5ML PO SOLN
650.0000 mg | Freq: Once | ORAL | Status: AC
  Administered 2017-06-10: 650 mg via ORAL
  Filled 2017-06-10: qty 20.3

## 2017-06-10 MED ORDER — LACTATED RINGERS IV BOLUS (SEPSIS)
1000.0000 mL | Freq: Once | INTRAVENOUS | Status: AC
  Administered 2017-06-10: 1000 mL via INTRAVENOUS

## 2017-06-10 MED ORDER — PROMETHAZINE HCL 25 MG/ML IJ SOLN
25.0000 mg | Freq: Once | INTRAMUSCULAR | Status: AC
  Administered 2017-06-10: 25 mg via INTRAVENOUS
  Filled 2017-06-10: qty 1

## 2017-06-10 NOTE — MAU Note (Addendum)
Pt reports severe HA x5 days- much worse today. Denies hx of HA or migraines. States she has a lot of neck pain that started on day 3 of HA. States that she has been very sensitive to lights and noises. Reports a fever of 102 yesterday. Denies any flu-like symptoms or being around anyone sick. States she called doctor yesterday and was told to come in but she did not due to childcare issues and transportation. Last took tylenol around 1800 tonight-has not helped.

## 2017-06-10 NOTE — MAU Provider Note (Signed)
History     CSN: [id number]  Arrival date and time: 06/10/17 2158  First Provider Initiated Contact with Patient 06/10/17 2228      Chief Complaint  Patient presents with  . Headache   HPI Amber Howard is a 21 y.o. [id number] at [redacted]w[redacted]d with twins who presents with headache. Gradual onset of generalized headache that started 5 days ago. Describes as throbbing pain that she rates 10/10. Has been taking tylenol without relief. Pain is throughout head & radiates to neck. Pain worse with light, sounds, & movement. Endorses nausea & 1 episode of vomiting last night. Denies abdominal pain, vaginal bleeding, dysuria, tinnitus, chest pain, or SOB. Has had fever for the last 2 days, up to 102. Has hx of migraines when she was younger & was treated in MAU last October for same complaint (headache & fever).   OB History    Gravida Para Term Preterm AB Living   6 3 2 1 2 4    SAB TAB Ectopic Multiple Live Births   2     1 4       Past Medical History:  Diagnosis Date  . Anemia   . Fibroids   . Migraine   . Miscarriage     Past Surgical History:  Procedure Laterality Date  . CESAREAN SECTION  2013  . CESAREAN SECTION N/A 11/05/2016   Procedure: CESAREAN SECTION;  Surgeon: Caren Macadam, MD;  Location: [place];  Service: Obstetrics;  Laterality: N/A;  . NOSE SURGERY  2015    Family History  Problem Relation Age of Onset  . Anemia Mother   . Fibroids Mother     Social History  Substance Use Topics  . Smoking status: Current Some Day Smoker    Packs/day: 0.25    Types: Cigarettes    Last attempt to quit: 10/20/2014  . Smokeless tobacco: Never Used  . Alcohol use No    Allergies:  Allergies  Allergen Reactions  . 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 Bee Venom Anaphylaxis  . Onion Anaphylaxis  . Other Anaphylaxis, Hives, Itching and Other (See Comments)    Pt states that she is allergic to fire ants.    . Wasp Venom Protein Anaphylaxis  . Benadryl  [Diphenhydramine] Hives  . [name] Isothiocyanate] Hives  . Orange Fruit [Citrus] Hives    Prescriptions Prior to Admission  Medication Sig Dispense Refill Last Dose  . senna (SENOKOT) 8.6 MG TABS tablet Take 1 tablet (8.6 mg total) by mouth daily as needed for mild constipation. (Patient not taking: Reported on 06/04/2017) 15 each 0 Not Taking at Unknown time    Review of Systems  Constitutional: Positive for fever. Negative for chills.  HENT: Negative for congestion, sore throat and tinnitus.   Eyes: Positive for photophobia.  Respiratory: Negative for cough and shortness of breath.   Cardiovascular: Negative for chest pain.  Gastrointestinal: Positive for nausea and vomiting. Negative for abdominal pain, constipation and diarrhea.  Genitourinary: Negative for dysuria, hematuria and vaginal bleeding.  Neurological: Positive for headaches.   Physical Exam   Blood pressure (!) 101/57, pulse 76, temperature 97.6 F (36.4 C), temperature source Oral, resp. rate 17, height 5\' 2"  (1.575 m), weight 185 lb (83.9 kg), last menstrual period 04/15/2017, SpO2 96 %, unknown if currently breastfeeding.  Physical Exam  Nursing note and vitals reviewed. Constitutional: She is oriented to person, place, and time. She appears well-developed and well-nourished. She appears distressed.  HENT:  Head: Normocephalic and atraumatic.  Eyes: Conjunctivae are normal. Right eye exhibits no discharge. Left eye exhibits no discharge. No scleral icterus.  Neck: Normal range of motion. Muscular tenderness present. No neck rigidity.  Cardiovascular: Normal rate, regular rhythm and normal heart sounds.   No murmur heard. Respiratory: Effort normal and breath sounds normal. No respiratory distress. She has no wheezes.  GI: Soft. There is no tenderness.  Neurological: She is alert and oriented to person, place, and time.  Skin: Skin is warm and dry. She is not diaphoretic.  Psychiatric: She has a normal  mood and affect. Her behavior is normal. Judgment and thought content normal.    MAU Course  Procedures Results for orders placed or performed during the hospital encounter of 06/10/17 (from the past 24 hour(s))  Urinalysis, Routine w reflex microscopic     Status: Abnormal   Collection Time: 06/10/17 10:09 PM  Result Value Ref Range   Color, Urine YELLOW YELLOW   APPearance CLOUDY (A) CLEAR   Specific Gravity, Urine 1.010 1.005 - 1.030   pH 5.5 5.0 - 8.0   Glucose, UA NEGATIVE NEGATIVE mg/dL   Hgb urine dipstick MODERATE (A) NEGATIVE   Bilirubin Urine NEGATIVE NEGATIVE   Ketones, ur NEGATIVE NEGATIVE mg/dL   Protein, ur 30 (A) NEGATIVE mg/dL   Nitrite NEGATIVE NEGATIVE   Leukocytes, UA LARGE (A) NEGATIVE  Urinalysis, Microscopic (reflex)     Status: Abnormal   Collection Time: 06/10/17 10:09 PM  Result Value Ref Range   RBC / HPF 0-5 0 - 5 RBC/hpf   WBC, UA TOO NUMEROUS TO COUNT 0 - 5 WBC/hpf   Bacteria, UA MANY (A) NONE SEEN   Squamous Epithelial / LPF 0-5 (A) NONE SEEN  CBC with Differential/Platelet     Status: Abnormal   Collection Time: 06/10/17 10:50 PM  Result Value Ref Range   WBC 8.4 4.0 - 10.5 K/uL   RBC 3.74 (L) 3.87 - 5.11 MIL/uL   Hemoglobin 8.9 (L) 12.0 - 15.0 g/dL   HCT 25.9 (L) 36.0 - 46.0 %   MCV 69.3 (L) 78.0 - 100.0 fL   MCH 23.8 (L) 26.0 - 34.0 pg   MCHC 34.4 30.0 - 36.0 g/dL   RDW 16.9 (H) 11.5 - 15.5 %   Platelets 206 150 - 400 K/uL   Neutrophils Relative % 75 %   Neutro Abs 6.3 1.7 - 7.7 K/uL   Lymphocytes Relative 18 %   Lymphs Abs 1.5 0.7 - 4.0 K/uL   Monocytes Relative 7 %   Monocytes Absolute 0.6 0.1 - 1.0 K/uL   Eosinophils Relative 0 %   Eosinophils Absolute 0.0 0.0 - 0.7 K/uL   Basophils Relative 0 %   Basophils Absolute 0.0 0.0 - 0.1 K/uL  Comprehensive metabolic panel     Status: Abnormal   Collection Time: 06/10/17 10:50 PM  Result Value Ref Range   Sodium 131 (L) 135 - 145 mmol/L   Potassium 2.8 (L) 3.5 - 5.1 mmol/L   Chloride  98 (L) 101 - 111 mmol/L   CO2 23 22 - 32 mmol/L   Glucose, Bld 100 (H) 65 - 99 mg/dL   BUN <5 (L) 6 - 20 mg/dL   Creatinine, Ser 0.60 0.44 - 1.00 mg/dL   Calcium 8.5 (L) 8.9 - 10.3 mg/dL   Total Protein 6.7 6.5 - 8.1 g/dL   Albumin 2.5 (L) 3.5 - 5.0 g/dL   AST 21 15 - 41 U/L   ALT 12 (L) 14 - 54 U/L  Alkaline Phosphatase 50 38 - 126 U/L   Total Bilirubin 0.6 0.3 - 1.2 mg/dL   GFR calc non Af Amer >60 >60 mL/min   GFR calc Af Amer >60 >60 mL/min   Anion gap 10 5 - 15    MDM FHT 152 & 158 CBC -- WBC wnl Tylenol 650 mg PO IV fluids, decadron & phenergan (pt allergic to benadryl) Per review of records, pt has positive urine culture w/e coli last week when she was seen in the ED for abdominal pain. Was never prescribed abx. Rocephin 2 gm IV given tonight & will d/c home with macrobid. Pt denies urinary complaints & no CVAT.  Pt reports no improvement in headache, will give dose of flexeril 10 mg PO -- no improvement after flexeril C/w Dr. Elonda Husky. Will give dose of fioricet & 20 minutes of oxygen therapy. Patient reports mild improvement in pain & sleeping when staff enters the room. Will d/c home.  Assessment and Plan  A: 1. Chronic intractable headache, unspecified headache type   2. Fetal heart tones present, second trimester   3. E. coli UTI (urinary tract infection)    P: Discharge home Rx macrobid, lortab, flexeril Discussed reasons to return to MAU vs ED Schedule prenatal care with [place] 06/10/2017, 10:27 PM

## 2017-06-11 DIAGNOSIS — R51 Headache: Secondary | ICD-10-CM

## 2017-06-11 DIAGNOSIS — O9989 Other specified diseases and conditions complicating pregnancy, childbirth and the puerperium: Secondary | ICD-10-CM | POA: Diagnosis not present

## 2017-06-11 LAB — COMPREHENSIVE METABOLIC PANEL
ALT: 12 U/L — ABNORMAL LOW (ref 14–54)
AST: 21 U/L (ref 15–41)
Albumin: 2.5 g/dL — ABNORMAL LOW (ref 3.5–5.0)
Alkaline Phosphatase: 50 U/L (ref 38–126)
Anion gap: 10 (ref 5–15)
CHLORIDE: 98 mmol/L — AB (ref 101–111)
CO2: 23 mmol/L (ref 22–32)
Calcium: 8.5 mg/dL — ABNORMAL LOW (ref 8.9–10.3)
Creatinine, Ser: 0.6 mg/dL (ref 0.44–1.00)
Glucose, Bld: 100 mg/dL — ABNORMAL HIGH (ref 65–99)
POTASSIUM: 2.8 mmol/L — AB (ref 3.5–5.1)
Sodium: 131 mmol/L — ABNORMAL LOW (ref 135–145)
Total Bilirubin: 0.6 mg/dL (ref 0.3–1.2)
Total Protein: 6.7 g/dL (ref 6.5–8.1)

## 2017-06-11 LAB — URINALYSIS, MICROSCOPIC (REFLEX)

## 2017-06-11 LAB — URINALYSIS, ROUTINE W REFLEX MICROSCOPIC
Bilirubin Urine: NEGATIVE
GLUCOSE, UA: NEGATIVE mg/dL
Ketones, ur: NEGATIVE mg/dL
Nitrite: NEGATIVE
PROTEIN: 30 mg/dL — AB
SPECIFIC GRAVITY, URINE: 1.01 (ref 1.005–1.030)
pH: 5.5 (ref 5.0–8.0)

## 2017-06-11 MED ORDER — BUTALBITAL-APAP-CAFFEINE 50-325-40 MG PO TABS
2.0000 | ORAL_TABLET | Freq: Once | ORAL | Status: AC
  Administered 2017-06-11: 2 via ORAL
  Filled 2017-06-11: qty 2

## 2017-06-11 MED ORDER — DEXTROSE 5 % IV SOLN
2.0000 g | Freq: Once | INTRAVENOUS | Status: AC
  Administered 2017-06-11: 2 g via INTRAVENOUS
  Filled 2017-06-11: qty 2

## 2017-06-11 MED ORDER — SODIUM CHLORIDE 0.9 % IV BOLUS (SEPSIS)
1000.0000 mL | Freq: Once | INTRAVENOUS | Status: AC
  Administered 2017-06-11: 1000 mL via INTRAVENOUS

## 2017-06-11 MED ORDER — CYCLOBENZAPRINE HCL 10 MG PO TABS
10.0000 mg | ORAL_TABLET | Freq: Once | ORAL | Status: AC
  Administered 2017-06-11: 10 mg via ORAL
  Filled 2017-06-11: qty 1

## 2017-06-11 MED ORDER — CYCLOBENZAPRINE HCL 5 MG PO TABS: 5.0000 mg | ORAL_TABLET | Freq: Three times a day (TID) | ORAL | 0 refills | Status: DC | PRN

## 2017-06-11 MED ORDER — HYDROCODONE-ACETAMINOPHEN 5-325 MG PO TABS: 2.0000 | ORAL_TABLET | Freq: Four times a day (QID) | ORAL | 0 refills | Status: DC | PRN

## 2017-06-11 MED ORDER — NITROFURANTOIN MONOHYD MACRO 100 MG PO CAPS: 100.0000 mg | ORAL_CAPSULE | Freq: Two times a day (BID) | ORAL | 0 refills | Status: DC

## 2017-06-11 NOTE — Discharge Instructions (Signed)
Pregnancy and Urinary Tract Infection What is a urinary tract infection? A urinary tract infection (UTI) is an infection of any part of the urinary tract. This includes the kidneys, the tubes that connect your kidneys to your bladder (ureters), the bladder, and the tube that carries urine out of your body (urethra). These organs make, store, and get rid of urine in the body. A UTI can be a bladder infection (cystitis) or a kidney infection (pyelonephritis). This infection may be caused by fungi, viruses, and bacteria. Bacteria are the most common cause of UTIs. You are more likely to develop a UTI during pregnancy because:  The physical and hormonal changes your body goes through can make it easier for bacteria to get into your urinary tract.  Your growing baby puts pressure on your uterus and can affect urine flow.  Does a UTI place my baby at risk? An untreated UTI during pregnancy could lead to a kidney infection, which can cause health problems that could affect your baby. Possible complications of an untreated UTI include:  Having your baby before 37 weeks of pregnancy (premature).  Having a baby with a low birth weight.  Developing high blood pressure during pregnancy (preeclampsia).  What are the symptoms of a UTI? Symptoms of a UTI include:  Fever.  Frequent urination or passing small amounts of urine frequently.  Needing to urinate urgently.  Pain or a burning sensation with urination.  Urine that smells bad or unusual.  Cloudy urine.  Pain in the lower abdomen or back.  Trouble urinating.  Blood in the urine.  Vomiting or being less hungry than normal.  Diarrhea or abdominal pain.  Vaginal discharge.  What are the treatment options for a UTI during pregnancy? Treatment for this condition may include:  Antibiotic medicines that are safe to take during pregnancy.  Other medicines to treat less common causes of UTI.  How can I prevent a UTI?  To prevent a  UTI:  Go to the bathroom as soon as you feel the need.  Always wipe from front to back.  Wash your genital area with soap and warm water daily.  Empty your bladder before and after sex.  Wear cotton underwear.  Limit your intake of high sugar foods or drinks, such as regular soda, juice, and sweets.  Drink 6-8 glasses of water daily.  Do not wear tight-fitting pants.  Do not douche or use deodorant sprays.  Do not drink alcohol, caffeine, or carbonated drinks. These can irritate the bladder.  Contact a health care provider if:  Your symptoms do not improve or get worse.  You have a fever after two days of treatment.  You have a rash.  You have abnormal vaginal discharge.  You have back or side pain.  You have chills.  You have nausea and vomiting. Get help right away if: Seek immediate medical care if you are pregnant and:  You feel contractions in your uterus.  You have lower belly pain.  You have a gush of fluid from your vagina.  You have blood in your urine.  You are vomiting and cannot keep down any medicines or water.  This information is not intended to replace advice given to you by your health care provider. Make sure you discuss any questions you have with your health care provider. Document Released: 04/11/2011 Document Revised: 11/28/2016 Document Reviewed: 11/05/2015 Elsevier Interactive Patient Education  2017 [place] Headache Without Cause A headache is pain or discomfort felt around  the head or neck area. The specific cause of a headache may not be found. There are many causes and types of headaches. A few common ones are:  Tension headaches.  Migraine headaches.  Cluster headaches.  Chronic daily headaches.  Follow these instructions at home: Watch your condition for any changes. Take these steps to help with your condition: Managing pain  Take over-the-counter and prescription medicines only as told by your health  care provider.  Lie down in a dark, quiet room when you have a headache.  If directed, apply ice to the head and neck area: ? Put ice in a plastic bag. ? Place a towel between your skin and the bag. ? Leave the ice on for 20 minutes, 2-3 times per day.  Use a heating pad or hot shower to apply heat to the head and neck area as told by your health care provider.  Keep lights dim if bright lights bother you or make your headaches worse. Eating and drinking  Eat meals on a regular schedule.  Limit alcohol use.  Decrease the amount of caffeine you drink, or stop drinking caffeine. General instructions  Keep all follow-up visits as told by your health care provider. This is important.  Keep a headache journal to help find out what may trigger your headaches. For example, write down: ? What you eat and drink. ? How much sleep you get. ? Any change to your diet or medicines.  Try massage or other relaxation techniques.  Limit stress.  Sit up straight, and do not tense your muscles.  Do not use tobacco products, including cigarettes, chewing tobacco, or e-cigarettes. If you need help quitting, ask your health care provider.  Exercise regularly as told by your health care provider.  Sleep on a regular schedule. Get 7-9 hours of sleep, or the amount recommended by your health care provider. Contact a health care provider if:  Your symptoms are not helped by medicine.  You have a headache that is different from the usual headache.  You have nausea or you vomit.  You have a fever. Get help right away if:  Your headache becomes severe.  You have repeated vomiting.  You have a stiff neck.  You have a loss of vision.  You have problems with speech.  You have pain in the eye or ear.  You have muscular weakness or loss of muscle control.  You lose your balance or have trouble walking.  You feel faint or pass out.  You have confusion. This information is not  intended to replace advice given to you by your health care provider. Make sure you discuss any questions you have with your health care provider. Document Released: 12/15/2005 Document Revised: 05/22/2016 Document Reviewed: 04/09/2015 Elsevier Interactive Patient Education  2017 [employer].

## 2017-06-11 NOTE — MAU Note (Signed)
Patient unable to receive O2; taxi cab here to take patient and FOB home.

## 2017-06-14 ENCOUNTER — Telehealth: Payer: Self-pay

## 2017-06-14 NOTE — Telephone Encounter (Signed)
Post ED Visit - Positive Culture Follow-up: Successful Patient Follow-Up  Culture assessed and recommendations reviewed by: []  Elenor Quinones, Pharm.D. []  Heide Guile, Pharm.D., BCPS AQ-ID []  Parks Neptune, Pharm.D., BCPS [x]  Alycia Rossetti, Pharm.D., BCPS []  [place], Pharm.D., BCPS, AAHIVP []  Legrand Como, Pharm.D., BCPS, AAHIVP []  Salome Arnt, PharmD, BCPS []  Dimitri Ped, PharmD, BCPS []  Vincenza Hews, PharmD, BCPS  Positive urine culture  [x]  Patient discharged without antimicrobial prescription and treatment is now indicated []  Organism is resistant to prescribed ED discharge antimicrobial []  Patient with positive blood cultures  Changes discussed with ED provider: Shary Decamp PA-C New antibiotic prescription Macrobid 100mg  PO BID x 7 days Called to [facility] on [phone]  Contacted patient, date 06/14/17, time 1158   Amber Howard, Carolynn Comment 06/14/2017, 11:57 AM

## 2017-07-15 ENCOUNTER — Encounter: Payer: Medicaid Other | Admitting: Obstetrics & Gynecology

## 2017-08-05 ENCOUNTER — Encounter: Payer: Medicaid Other | Admitting: Obstetrics & Gynecology

## 2017-08-16 ENCOUNTER — Encounter (HOSPITAL_COMMUNITY): Payer: Self-pay

## 2017-08-16 ENCOUNTER — Inpatient Hospital Stay (HOSPITAL_COMMUNITY): Payer: Medicaid Other

## 2017-08-16 ENCOUNTER — Inpatient Hospital Stay (HOSPITAL_COMMUNITY)
Admission: AD | Admit: 2017-08-16 | Discharge: 2017-08-16 | Disposition: A | Discharge status: Home / Self Care | Payer: Medicaid Other | Source: Ambulatory Visit | Attending: Obstetrics & Gynecology | Admitting: Obstetrics & Gynecology

## 2017-08-16 DIAGNOSIS — O30032 Twin pregnancy, monochorionic/diamniotic, second trimester: Secondary | ICD-10-CM | POA: Diagnosis not present

## 2017-08-16 DIAGNOSIS — O99332 Smoking (tobacco) complicating pregnancy, second trimester: Secondary | ICD-10-CM | POA: Insufficient documentation

## 2017-08-16 DIAGNOSIS — Z886 Allergy status to analgesic agent status: Secondary | ICD-10-CM | POA: Insufficient documentation

## 2017-08-16 DIAGNOSIS — Z3A25 25 weeks gestation of pregnancy: Secondary | ICD-10-CM | POA: Diagnosis not present

## 2017-08-16 DIAGNOSIS — Z3686 Encounter for antenatal screening for cervical length: Secondary | ICD-10-CM

## 2017-08-16 DIAGNOSIS — Z98891 History of uterine scar from previous surgery: Secondary | ICD-10-CM

## 2017-08-16 DIAGNOSIS — F17210 Nicotine dependence, cigarettes, uncomplicated: Secondary | ICD-10-CM | POA: Diagnosis not present

## 2017-08-16 DIAGNOSIS — O99012 Anemia complicating pregnancy, second trimester: Secondary | ICD-10-CM | POA: Diagnosis not present

## 2017-08-16 DIAGNOSIS — O99612 Diseases of the digestive system complicating pregnancy, second trimester: Secondary | ICD-10-CM | POA: Insufficient documentation

## 2017-08-16 DIAGNOSIS — A084 Viral intestinal infection, unspecified: Secondary | ICD-10-CM | POA: Diagnosis not present

## 2017-08-16 DIAGNOSIS — M5431 Sciatica, right side: Secondary | ICD-10-CM | POA: Diagnosis not present

## 2017-08-16 DIAGNOSIS — O0932 Supervision of pregnancy with insufficient antenatal care, second trimester: Secondary | ICD-10-CM | POA: Insufficient documentation

## 2017-08-16 DIAGNOSIS — O34211 Maternal care for low transverse scar from previous cesarean delivery: Secondary | ICD-10-CM | POA: Diagnosis not present

## 2017-08-16 DIAGNOSIS — O4290 Premature rupture of membranes, unspecified as to length of time between rupture and onset of labor, unspecified weeks of gestation: Secondary | ICD-10-CM

## 2017-08-16 DIAGNOSIS — O26892 Other specified pregnancy related conditions, second trimester: Secondary | ICD-10-CM | POA: Insufficient documentation

## 2017-08-16 DIAGNOSIS — D649 Anemia, unspecified: Secondary | ICD-10-CM | POA: Insufficient documentation

## 2017-08-16 DIAGNOSIS — O30049 Twin pregnancy, dichorionic/diamniotic, unspecified trimester: Secondary | ICD-10-CM | POA: Diagnosis present

## 2017-08-16 LAB — URINALYSIS, ROUTINE W REFLEX MICROSCOPIC
BILIRUBIN URINE: NEGATIVE
Glucose, UA: NEGATIVE mg/dL
HGB URINE DIPSTICK: NEGATIVE
KETONES UR: NEGATIVE mg/dL
Leukocytes, UA: NEGATIVE
Nitrite: NEGATIVE
PH: 7 (ref 5.0–8.0)
Protein, ur: NEGATIVE mg/dL
SPECIFIC GRAVITY, URINE: 1.015 (ref 1.005–1.030)

## 2017-08-16 LAB — AMNISURE RUPTURE OF MEMBRANE (ROM) NOT AT ARMC: Amnisure ROM: NEGATIVE

## 2017-08-16 LAB — WET PREP, GENITAL
Clue Cells Wet Prep HPF POC: NONE SEEN
SPERM: NONE SEEN
Trich, Wet Prep: NONE SEEN
Yeast Wet Prep HPF POC: NONE SEEN

## 2017-08-16 LAB — CBC
HEMATOCRIT: 23.7 % — AB (ref 36.0–46.0)
Hemoglobin: 7.7 g/dL — ABNORMAL LOW (ref 12.0–15.0)
MCH: 23 pg — AB (ref 26.0–34.0)
MCHC: 32.5 g/dL (ref 30.0–36.0)
MCV: 70.7 fL — AB (ref 78.0–100.0)
Platelets: 265 10*3/uL (ref 150–400)
RBC: 3.35 MIL/uL — AB (ref 3.87–5.11)
RDW: 16.3 % — ABNORMAL HIGH (ref 11.5–15.5)
WBC: 6.6 10*3/uL (ref 4.0–10.5)

## 2017-08-16 MED ORDER — FERROUS SULFATE 325 (65 FE) MG PO TABS: 325.0000 mg | ORAL_TABLET | Freq: Two times a day (BID) | ORAL | 1 refills | Status: DC

## 2017-08-16 MED ORDER — PRENATAL PLUS 27-1 MG PO TABS: 1.0000 | ORAL_TABLET | Freq: Every day | ORAL | 0 refills | Status: AC

## 2017-08-16 MED ORDER — ONDANSETRON 4 MG PO TBDP
4.0000 mg | ORAL_TABLET | Freq: Once | ORAL | Status: AC
  Administered 2017-08-16: 4 mg via ORAL
  Filled 2017-08-16: qty 1

## 2017-08-16 NOTE — Discharge Instructions (Signed)
Second Trimester of Pregnancy The second trimester is from week 14 through week 27 (months 4 through 6). The second trimester is often a time when you feel your best. Your body has adjusted to being pregnant, and you begin to feel better physically. Usually, morning sickness has lessened or quit completely, you may have more energy, and you may have an increase in appetite. The second trimester is also a time when the fetus is growing rapidly. At the end of the sixth month, the fetus is about 9 inches long and weighs about 1 pounds. You will likely begin to feel the baby move (quickening) between 16 and 20 weeks of pregnancy. Body changes during your second trimester Your body continues to go through many changes during your second trimester. The changes vary from woman to woman.  Your weight will continue to increase. You will notice your lower abdomen bulging out.  You may begin to get stretch marks on your hips, abdomen, and breasts.  You may develop headaches that can be relieved by medicines. The medicines should be approved by your health care provider.  You may urinate more often because the fetus is pressing on your bladder.  You may develop or continue to have heartburn as a result of your pregnancy.  You may develop constipation because certain hormones are causing the muscles that push waste through your intestines to slow down.  You may develop hemorrhoids or swollen, bulging veins (varicose veins).  You may have back pain. This is caused by: ? Weight gain. ? Pregnancy hormones that are relaxing the joints in your pelvis. ? A shift in weight and the muscles that support your balance.  Your breasts will continue to grow and they will continue to become tender.  Your gums may bleed and may be sensitive to brushing and flossing.  Dark spots or blotches (chloasma, mask of pregnancy) may develop on your face. This will likely fade after the baby is born.  A dark line from your  belly button to the pubic area (linea nigra) may appear. This will likely fade after the baby is born.  You may have changes in your hair. These can include thickening of your hair, rapid growth, and changes in texture. Some women also have hair loss during or after pregnancy, or hair that feels dry or thin. Your hair will most likely return to normal after your baby is born.  What to expect at prenatal visits During a routine prenatal visit:  You will be weighed to make sure you and the fetus are growing normally.  Your blood pressure will be taken.  Your abdomen will be measured to track your baby's growth.  The fetal heartbeat will be listened to.  Any test results from the previous visit will be discussed.  Your health care provider may ask you:  How you are feeling.  If you are feeling the baby move.  If you have had any abnormal symptoms, such as leaking fluid, bleeding, severe headaches, or abdominal cramping.  If you are using any tobacco products, including cigarettes, chewing tobacco, and electronic cigarettes.  If you have any questions.  Other tests that may be performed during your second trimester include:  Blood tests that check for: ? Low iron levels (anemia). ? High blood sugar that affects pregnant women (gestational diabetes) between 61 and 28 weeks. ? Rh antibodies. This is to check for a protein on red blood cells (Rh factor).  Urine tests to check for infections, diabetes, or  protein in the urine.  An ultrasound to confirm the proper growth and development of the baby.  An amniocentesis to check for possible genetic problems.  Fetal screens for spina bifida and Down syndrome.  HIV (human immunodeficiency virus) testing. Routine prenatal testing includes screening for HIV, unless you choose not to have this test.  Follow these instructions at home: Medicines  Follow your health care provider's instructions regarding medicine use. Specific  medicines may be either safe or unsafe to take during pregnancy.  Take a prenatal vitamin that contains at least 600 micrograms (mcg) of folic acid.  If you develop constipation, try taking a stool softener if your health care provider approves. Eating and drinking  Eat a balanced diet that includes fresh fruits and vegetables, whole grains, good sources of protein such as meat, eggs, or tofu, and low-fat dairy. Your health care provider will help you determine the amount of weight gain that is right for you.  Avoid raw meat and uncooked cheese. These carry germs that can cause birth defects in the baby.  If you have low calcium intake from food, talk to your health care provider about whether you should take a daily calcium supplement.  Limit foods that are high in fat and processed sugars, such as fried and sweet foods.  To prevent constipation: ? Drink enough fluid to keep your urine clear or pale yellow. ? Eat foods that are high in fiber, such as fresh fruits and vegetables, whole grains, and beans. Activity  Exercise only as directed by your health care provider. Most women can continue their usual exercise routine during pregnancy. Try to exercise for 30 minutes at least 5 days a week. Stop exercising if you experience uterine contractions.  Avoid heavy lifting, wear low heel shoes, and practice good posture.  A sexual relationship may be continued unless your health care provider directs you otherwise. Relieving pain and discomfort  Wear a good support bra to prevent discomfort from breast tenderness.  Take warm sitz baths to soothe any pain or discomfort caused by hemorrhoids. Use hemorrhoid cream if your health care provider approves.  Rest with your legs elevated if you have leg cramps or low back pain.  If you develop varicose veins, wear support hose. Elevate your feet for 15 minutes, 3-4 times a day. Limit salt in your diet. Prenatal Care  Write down your questions.  Take them to your prenatal visits.  Keep all your prenatal visits as told by your health care provider. This is important. Safety  Wear your seat belt at all times when driving.  Make a list of emergency phone numbers, including numbers for family, friends, the hospital, and police and fire departments. General instructions  Ask your health care provider for a referral to a local prenatal education class. Begin classes no later than the beginning of month 6 of your pregnancy.  Ask for help if you have counseling or nutritional needs during pregnancy. Your health care provider can offer advice or refer you to specialists for help with various needs.  Do not use hot tubs, steam rooms, or saunas.  Do not douche or use tampons or scented sanitary pads.  Do not cross your legs for long periods of time.  Avoid cat litter boxes and soil used by cats. These carry germs that can cause birth defects in the baby and possibly loss of the fetus by miscarriage or stillbirth.  Avoid all smoking, herbs, alcohol, and unprescribed drugs. Chemicals in these products can  affect the formation and growth of the baby.  Do not use any products that contain nicotine or tobacco, such as cigarettes and e-cigarettes. If you need help quitting, ask your health care provider.  Visit your dentist if you have not gone yet during your pregnancy. Use a soft toothbrush to brush your teeth and be gentle when you floss. Contact a health care provider if:  You have dizziness.  You have mild pelvic cramps, pelvic pressure, or nagging pain in the abdominal area.  You have persistent nausea, vomiting, or diarrhea.  You have a bad smelling vaginal discharge.  You have pain when you urinate. Get help right away if:  You have a fever.  You are leaking fluid from your vagina.  You have spotting or bleeding from your vagina.  You have severe abdominal cramping or pain.  You have rapid weight gain or weight  loss.  You have shortness of breath with chest pain.  You notice sudden or extreme swelling of your face, hands, ankles, feet, or legs.  You have not felt your baby move in over an hour.  You have severe headaches that do not go away when you take medicine.  You have vision changes. Summary  The second trimester is from week 14 through week 27 (months 4 through 6). It is also a time when the fetus is growing rapidly.  Your body goes through many changes during pregnancy. The changes vary from woman to woman.  Avoid all smoking, herbs, alcohol, and unprescribed drugs. These chemicals affect the formation and growth your baby.  Do not use any tobacco products, such as cigarettes, chewing tobacco, and e-cigarettes. If you need help quitting, ask your health care provider.  Contact your health care provider if you have any questions. Keep all prenatal visits as told by your health care provider. This is important. This information is not intended to replace advice given to you by your health care provider. Make sure you discuss any questions you have with your health care provider. Document Released: 12/09/2001 Document Revised: 05/22/2016 Document Reviewed: 02/15/2013 Elsevier Interactive Patient Education  2017 [employer] Sciatica Sciatica is pain, numbness, weakness, or tingling along the path of the sciatic nerve. The sciatic nerve starts in the lower back and runs down the back of each leg. The nerve controls the muscles in the lower leg and in the back of the knee. It also provides feeling (sensation) to the back of the thigh, the lower leg, and the sole of the foot. Sciatica is a symptom of another medical condition that pinches or puts pressure on the sciatic nerve. Generally, sciatica only affects one side of the body. Sciatica usually goes away on its own or with treatment. In some cases, sciatica may keep coming back (recur). What are the causes? This condition is caused by  pressure on the sciatic nerve, or pinching of the sciatic nerve. This may be the result of:  A disk in between the bones of the spine (vertebrae) bulging out too far (herniated disk).  Age-related changes in the spinal disks (degenerative disk disease).  A pain disorder that affects a muscle in the buttock (piriformis syndrome).  Extra bone growth (bone spur) near the sciatic nerve.  An injury or break (fracture) of the pelvis.  Pregnancy.  Tumor (rare).  What increases the risk? The following factors may make you more likely to develop this condition:  Playing sports that place pressure or stress on the spine, such as football or  weight lifting.  Having poor strength and flexibility.  A history of back injury.  A history of back surgery.  Sitting for long periods of time.  Doing activities that involve repetitive bending or lifting.  Obesity.  What are the signs or symptoms? Symptoms can vary from mild to very severe, and they may include:  Any of these problems in the lower back, leg, hip, or buttock: ? Mild tingling or dull aches. ? Burning sensations. ? Sharp pains.  Numbness in the back of the calf or the sole of the foot.  Leg weakness.  Severe back pain that makes movement difficult.  These symptoms may get worse when you cough, sneeze, or laugh, or when you sit or stand for long periods of time. Being overweight may also make symptoms worse. In some cases, symptoms may recur over time. How is this diagnosed? This condition may be diagnosed based on:  Your symptoms.  A physical exam. Your health care provider may ask you to do certain movements to check whether those movements trigger your symptoms.  You may have tests, including: ? Blood tests. ? X-rays. ? MRI. ? CT scan.  How is this treated? In many cases, this condition improves on its own, without any treatment. However, treatment may include:  Reducing or modifying physical activity during  periods of pain.  Exercising and stretching to strengthen your abdomen and improve the flexibility of your spine.  Icing and applying heat to the affected area.  Medicines that help: ? To relieve pain and swelling. ? To relax your muscles.  Injections of medicines that help to relieve pain, irritation, and inflammation around the sciatic nerve (steroids).  Surgery.  Follow these instructions at home: Medicines  Take over-the-counter and prescription medicines only as told by your health care provider.  Do not drive or operate heavy machinery while taking prescription pain medicine. Managing pain  If directed, apply ice to the affected area. ? Put ice in a plastic bag. ? Place a towel between your skin and the bag. ? Leave the ice on for 20 minutes, 2-3 times a day.  After icing, apply heat to the affected area before you exercise or as often as told by your health care provider. Use the heat source that your health care provider recommends, such as a moist heat pack or a heating pad. ? Place a towel between your skin and the heat source. ? Leave the heat on for 20-30 minutes. ? Remove the heat if your skin turns bright red. This is especially important if you are unable to feel pain, heat, or cold. You may have a greater risk of getting burned. Activity  Return to your normal activities as told by your health care provider. Ask your health care provider what activities are safe for you. ? Avoid activities that make your symptoms worse.  Take brief periods of rest throughout the day. Resting in a lying or standing position is usually better than sitting to rest. ? When you rest for longer periods, mix in some mild activity or stretching between periods of rest. This will help to prevent stiffness and pain. ? Avoid sitting for long periods of time without moving. Get up and move around at least one time each hour.  Exercise and stretch regularly, as told by your health care  provider.  Do not lift anything that is heavier than 10 lb (4.5 kg) while you have symptoms of sciatica. When you do not have symptoms, you should still  avoid heavy lifting, especially repetitive heavy lifting.  When you lift objects, always use proper lifting technique, which includes: ? Bending your knees. ? Keeping the load close to your body. ? Avoiding twisting. General instructions  Use good posture. ? Avoid leaning forward while sitting. ? Avoid hunching over while standing.  Maintain a healthy weight. Excess weight puts extra stress on your back and makes it difficult to maintain good posture.  Wear supportive, comfortable shoes. Avoid wearing high heels.  Avoid sleeping on a mattress that is too soft or too hard. A mattress that is firm enough to support your back when you sleep may help to reduce your pain.  Keep all follow-up visits as told by your health care provider. This is important. Contact a health care provider if:  You have pain that wakes you up when you are sleeping.  You have pain that gets worse when you lie down.  Your pain is worse than you have experienced in the past.  Your pain lasts longer than 4 weeks.  You experience unexplained weight loss. Get help right away if:  You lose control of your bowel or bladder (incontinence).  You have: ? Weakness in your lower back, pelvis, buttocks, or legs that gets worse. ? Redness or swelling of your back. ? A burning sensation when you urinate. This information is not intended to replace advice given to you by your health care provider. Make sure you discuss any questions you have with your health care provider. Document Released: 12/09/2001 Document Revised: 05/20/2016 Document Reviewed: 08/24/2015 Elsevier Interactive Patient Education  2017 [employer] Anemia, Nonspecific Anemia is a condition in which the concentration of red blood cells or hemoglobin in the blood is below normal. Hemoglobin is a  substance in red blood cells that carries oxygen to the tissues of the body. Anemia results in not enough oxygen reaching these tissues. What are the causes? Common causes of anemia include:  Excessive bleeding. Bleeding may be internal or external. This includes excessive bleeding from periods (in women) or from the intestine.  Poor nutrition.  Chronic kidney, thyroid, and liver disease.  Bone marrow disorders that decrease red blood cell production.  Cancer and treatments for cancer.  HIV, AIDS, and their treatments.  Spleen problems that increase red blood cell destruction.  Blood disorders.  Excess destruction of red blood cells due to infection, medicines, and autoimmune disorders.  What are the signs or symptoms?  Minor weakness.  Dizziness.  Headache.  Palpitations.  Shortness of breath, especially with exercise.  Paleness.  Cold sensitivity.  Indigestion.  Nausea.  Difficulty sleeping.  Difficulty concentrating. Symptoms may occur suddenly or they may develop slowly. How is this diagnosed? Additional blood tests are often needed. These help your health care provider determine the best treatment. Your health care provider will check your stool for blood and look for other causes of blood loss. How is this treated? Treatment varies depending on the cause of the anemia. Treatment can include:  Supplements of iron, vitamin S50, or folic acid.  Hormone medicines.  A blood transfusion. This may be needed if blood loss is severe.  Hospitalization. This may be needed if there is significant continual blood loss.  Dietary changes.  Spleen removal.  Follow these instructions at home: Keep all follow-up appointments. It often takes many weeks to correct anemia, and having your health care provider check on your condition and your response to treatment is very important. Get help right away if:  You  develop extreme weakness, shortness of breath, or chest  pain.  You become dizzy or have trouble concentrating.  You develop heavy vaginal bleeding.  You develop a rash.  You have bloody or black, tarry stools.  You faint.  You vomit up blood.  You vomit repeatedly.  You have abdominal pain.  You have a fever or persistent symptoms for more than 2-3 days.  You have a fever and your symptoms suddenly get worse.  You are dehydrated. This information is not intended to replace advice given to you by your health care provider. Make sure you discuss any questions you have with your health care provider. Document Released: 01/22/2005 Document Revised: 05/28/2016 Document Reviewed: 06/10/2013 Elsevier Interactive Patient Education  2017 [employer].

## 2017-08-16 NOTE — MAU Note (Addendum)
Arrived via ems Thursday--one episode leaking fluid Swelling in legs and feet  Yesterday states she started leaking more fluid +vaginal pressure  +diarrhea +lower back pain X 3 days Rating pain 8/10 Constant--cramping in nature Has sciatic   Patient has not started [facility] Missed her establish care appt 2 weeks ago

## 2017-08-16 NOTE — MAU Provider Note (Signed)
History     CSN: [id number]  Arrival date and time: 08/16/17 1240   First Provider Initiated Contact with Patient 08/16/17 1325      Chief Complaint  Patient presents with  . Rupture of Membranes  . Back Pain  . vaginal pressure  . edema   Amber Howard is a 21 y.o. [phone] at [redacted]w[redacted]d with mono-di twin gestation presents with possible leakage of fluid. She states that 4 days ago while walking she had a gush of fluid that soaked through her sweat pants and did not smell like urine. The same thing happened one time yesterday. No continuous trickle or leakage since yesterday. No vaginal bleeding or contractions. Good FM. No intercourse since June. Had neg GC/CT NPC and does not have appointment yet. Takes gummy PNV. Hs C/S x3. Hx PTB twins, FGR.  Ecoli ASB 06/04/17 not treated, BV 06/10/17 not treated. No other known pregnancy problems this pregnancy except first tri bleeding and obesity.  She also gives 1 day history of nausea without vomiting and a few episodes of loose stools. No fever or chills. No sick contacts. In addition she's had 1-2 weeks of shooting sharp pains intermittently from right buttock down radiating down right posterior thigh. Noticed LE swelling last night, none today.     OB History  Gravida Para Term Preterm AB Living  6 3 2 1 2 4   SAB TAB Ectopic Multiple Live Births  2     1 4     # Outcome Date GA Lbr Len/2nd Weight Sex Delivery Anes PTL Lv  6 Current           5 Term 11/05/16 [redacted]w[redacted]d  6 lb 6.1 oz (2.895 kg) M CS-LTranv Spinal  LIV  4A Preterm 07/07/15   4 lb 3 oz (1.899 kg) M CS-LTranv  Y LIV     Complications: IUGR (intrauterine growth restriction)  4B Preterm    4 lb 7 oz (2.013 kg) F CS-LTranv   LIV     Complications: IUGR (intrauterine growth restriction),Breech presentation at birth  3 Term 01/25/12 [redacted]w[redacted]d  6 lb 11 oz (3.033 kg) M CS-LTranv   LIV     Birth Comments: C/s fetal distress, nucal , anemia  2 SAB           1 SAB                 Past Medical History:  Diagnosis Date  . Anemia   . Fibroids   . Migraine   . Miscarriage     Past Surgical History:  Procedure Laterality Date  . CESAREAN SECTION  2013  . CESAREAN SECTION N/A 11/05/2016   Procedure: CESAREAN SECTION;  Surgeon: Caren Macadam, MD;  Location: [facility];  Service: Obstetrics;  Laterality: N/A;  . NOSE SURGERY  2015    Family History  Problem Relation Age of Onset  . Anemia Mother   . Fibroids Mother     Social History  Substance Use Topics  . Smoking status: Current Some Day Smoker    Packs/day: 0.25    Types: Cigarettes    Last attempt to quit: 10/20/2014  . Smokeless tobacco: Never Used  . Alcohol use No    Allergies:  Allergies  Allergen Reactions  . 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 Bee Venom Anaphylaxis  . Onion Anaphylaxis  . Other Anaphylaxis, Hives, Itching and Other (See Comments)    Pt states that she is allergic to fire ants.    Marland Kitchen  Wasp Venom Protein Anaphylaxis  . Benadryl [Diphenhydramine] Hives  . [name] Isothiocyanate] Hives  . Orange Fruit [Citrus] Hives    Prescriptions Prior to Admission  Medication Sig Dispense Refill Last Dose  . cyclobenzaprine (FLEXERIL) 5 MG tablet Take 1 tablet (5 mg total) by mouth 3 (three) times daily as needed for muscle spasms. 15 tablet 0   . HYDROcodone-acetaminophen (NORCO/VICODIN) 5-325 MG tablet Take 2 tablets by mouth every 6 (six) hours as needed. 15 tablet 0   . nitrofurantoin, macrocrystal-monohydrate, (MACROBID) 100 MG capsule Take 1 capsule (100 mg total) by mouth 2 (two) times daily. 14 capsule 0     Review of Systems  Constitutional: Negative for activity change, appetite change, fatigue and fever.  HENT: Negative for congestion and rhinorrhea.   Respiratory: Negative for chest tightness.   Gastrointestinal: Positive for diarrhea and nausea. Negative for abdominal pain and constipation.  Genitourinary: Positive for pelvic pain. Negative for  dysuria, frequency, hematuria, vaginal bleeding, vaginal discharge and vaginal pain.       Pelvic pressure  Musculoskeletal: Positive for back pain.       Mild LBP, moderate pain over right buttock radiates to posterior thigh  Neurological: Negative for dizziness.  Psychiatric/Behavioral: The patient is not nervous/anxious.    Physical Exam   Blood pressure (!) 114/56, pulse 90, temperature 98.6 F (37 C), temperature source Oral, resp. rate 18, last menstrual period 04/15/2017, SpO2 99 %, unknown if currently breastfeeding.  Physical Exam  Nursing note and vitals reviewed. Constitutional: She is oriented to person, place, and time. She appears well-developed and well-nourished. No distress.  HENT:  Head: Normocephalic.  Eyes: No scleral icterus.  Neck: Neck supple. No thyromegaly present.  Cardiovascular: Normal rate.   Respiratory: Effort normal.  GI: Soft. There is no tenderness.  Fundus at costal margin  Genitourinary: Vagina normal.  Genitourinary Comments: SSE: Vagina with scant white discharge, no blood or pooling Cx appears closed and long  Musculoskeletal: Normal range of motion. She exhibits no edema.  TTP over right S-I joint  Neurological: She is alert and oriented to person, place, and time.  Skin: Skin is warm and dry.    MAU Course  Procedures Results for orders placed or performed during the hospital encounter of 08/16/17 (from the past 24 hour(s))  Urinalysis, Routine w reflex microscopic     Status: Abnormal   Collection Time: 08/16/17 12:53 PM  Result Value Ref Range   Color, Urine YELLOW YELLOW   APPearance HAZY (A) CLEAR   Specific Gravity, Urine 1.015 1.005 - 1.030   pH 7.0 5.0 - 8.0   Glucose, UA NEGATIVE NEGATIVE mg/dL   Hgb urine dipstick NEGATIVE NEGATIVE   Bilirubin Urine NEGATIVE NEGATIVE   Ketones, ur NEGATIVE NEGATIVE mg/dL   Protein, ur NEGATIVE NEGATIVE mg/dL   Nitrite NEGATIVE NEGATIVE   Leukocytes, UA NEGATIVE NEGATIVE  Amnisure  rupture of membrane (rom)not at [facility]     Status: None   Collection Time: 08/16/17  2:04 PM  Result Value Ref Range   Amnisure ROM NEGATIVE   Wet prep, genital     Status: Abnormal   Collection Time: 08/16/17  2:57 PM  Result Value Ref Range   Yeast Wet Prep HPF POC NONE SEEN NONE SEEN   Trich, Wet Prep NONE SEEN NONE SEEN   Clue Cells Wet Prep HPF POC NONE SEEN NONE SEEN   WBC, Wet Prep HPF POC FEW (A) NONE SEEN   Sperm NONE SEEN   CBC  Status: Abnormal   Collection Time: 08/16/17  4:34 PM  Result Value Ref Range   WBC 6.6 4.0 - 10.5 K/uL   RBC 3.35 (L) 3.87 - 5.11 MIL/uL   Hemoglobin 7.7 (L) 12.0 - 15.0 g/dL   HCT 23.7 (L) 36.0 - 46.0 %   MCV 70.7 (L) 78.0 - 100.0 fL   MCH 23.0 (L) 26.0 - 34.0 pg   MCHC 32.5 30.0 - 36.0 g/dL   RDW 16.3 (H) 11.5 - 15.5 %   Platelets 265 150 - 400 K/uL   Fetal monitoring Right fetus discontinuous and unable to obtain any continuous tracing despite US guidance. Breech x2, Both fetuses active Left fetus baseline 140 moderate variability,no decelerations noted  Preliminary [place] result:  Dichorionic Fetus A: FHR 138, breech, plac postabove os; AFV subjectively normal  Fetus B: FHR 143, breech, plac left lat above os; AFV subjectively normal CL: 5.53  Zofran 4mg  ODT> nausea resolved completely. No N/V/D while under observation in MAU Ice to right S-I joint with some relief Advised to use ice or heat, acetaminophen doses reviewed  Severe Fe-defic anemia>Advised iron-rich foods and start oral supplements  Assessment and Plan  [id number] at [redacted]w[redacted]d 1. Viral gastroenteritis   2. Rupture, membranes, premature   3. Encounter for antenatal screening for cervical length   4. Monochorionic diamniotic twin gestation in second trimester   5. Insufficient prenatal care in second trimester   6. Anemia affecting pregnancy in second trimester   7. Previous cesarean section   8. Right sciatic nerve pain    Allergies as of 08/16/2017      Reactions   Asa  [aspirin] Anaphylaxis   Bee Venom Anaphylaxis   Onion Anaphylaxis   Other Anaphylaxis, Hives, Itching, Other (See Comments)   Pt states that she is allergic to fire ants.     Wasp Venom Protein Anaphylaxis   Benadryl [diphenhydramine] Hives   Mustard [allyl Isothiocyanate] Hives   Orange Fruit [citrus] Hives      Medication List    STOP taking these medications   cyclobenzaprine 5 MG tablet Commonly known as:  FLEXERIL   HYDROcodone-acetaminophen 5-325 MG tablet Commonly known as:  NORCO/VICODIN   nitrofurantoin (macrocrystal-monohydrate) 100 MG capsule Commonly known as:  MACROBID     TAKE these medications   ferrous sulfate 325 (65 FE) MG tablet Commonly known as:  FERROUSUL Take 1 tablet (325 mg total) by mouth 2 (two) times daily.   prenatal vitamin w/FE, FA 27-1 MG Tabs tablet Take 1 tablet by mouth daily.      Follow-up [place] for [place] [facility] Follow up in 1 week(s).   Specialty:  Obstetrics and Gynecology Contact information: [place] [phone]          [place] 08/16/2017, 1:25 PM

## 2017-08-16 NOTE — Progress Notes (Addendum)
G6P3L4 @ [redacted] wksga. Presents to triage for N/V/D. States had diarrhea for 3 days and nausea just started yesterday. Denies LOF or bleeding. EFM attempted to apply.   1330: Provider at bs with U/S. Seen one Heart beat with two babies. Unable to see beat of the other baby. Lots of movements noted.  Ordered for u/s.   1350: tried to get babies on the monitor after U/S at bs. Able to only get one. Provider made aware. Amnisure ordered.    1400: Amnisure done and taken to lab  1407: sonographer in unit. Informed needing U/S. Stated will be awhile d/t a stat order with some one else  1457: wet prep and pelvic done.   1505: pt to U/S via wheelchair.   1547:  back from U/S. Pending results:  1550:pt called out dt hip pain.   1555: provider at bs reassessing pt and to address hip pain.  1605: heating pads and drink given  1639: Lab at bs  1830: SVE closed and long.

## 2017-08-17 ENCOUNTER — Telehealth: Payer: Self-pay | Admitting: Obstetrics and Gynecology

## 2017-08-17 NOTE — Telephone Encounter (Signed)
-----   Message from Excell Seltzer, RN sent at 08/17/2017  9:09 AM EDT ----- Please schedule. Thanks, Claiborne Billings ----- Message ----- From: Lorene Dy, CNM Sent: 08/16/2017   6:45 PM To: Excell Seltzer, RN  High risk with NPC: mono-di twins, severe anemia, C/S x3. Needs [place] and completion of PN labs, That's why I asked for her to be scheduled asap Thx

## 2017-08-17 NOTE — Telephone Encounter (Signed)
Called patient to give her this appointment, and had to leave a message.

## 2017-08-18 ENCOUNTER — Encounter: Payer: Self-pay | Admitting: Obstetrics and Gynecology

## 2017-09-03 ENCOUNTER — Telehealth: Payer: Self-pay | Admitting: Family Medicine

## 2017-09-03 ENCOUNTER — Encounter: Payer: Self-pay | Admitting: Obstetrics and Gynecology

## 2017-09-03 NOTE — Telephone Encounter (Signed)
Patient would like a call back she is having leg swelling and she's not sure what to do

## 2017-09-10 NOTE — Telephone Encounter (Signed)
Called patient, no answer- phone immediately went to voicemail. Left message stating we are trying to reach you to return your phone call to see if you still need assistance, please call us back

## 2017-09-15 ENCOUNTER — Inpatient Hospital Stay (HOSPITAL_COMMUNITY)
Admission: AD | Admit: 2017-09-15 | Discharge: 2017-09-15 | Disposition: A | Discharge status: Home / Self Care | Payer: Medicaid Other | Source: Ambulatory Visit | Attending: Family Medicine | Admitting: Family Medicine

## 2017-09-15 ENCOUNTER — Encounter (HOSPITAL_COMMUNITY): Payer: Self-pay | Admitting: *Deleted

## 2017-09-15 DIAGNOSIS — O30043 Twin pregnancy, dichorionic/diamniotic, third trimester: Secondary | ICD-10-CM | POA: Diagnosis not present

## 2017-09-15 DIAGNOSIS — M7989 Other specified soft tissue disorders: Secondary | ICD-10-CM | POA: Diagnosis not present

## 2017-09-15 DIAGNOSIS — Z3A29 29 weeks gestation of pregnancy: Secondary | ICD-10-CM | POA: Diagnosis not present

## 2017-09-15 DIAGNOSIS — R51 Headache: Secondary | ICD-10-CM | POA: Diagnosis not present

## 2017-09-15 DIAGNOSIS — O26893 Other specified pregnancy related conditions, third trimester: Secondary | ICD-10-CM | POA: Diagnosis not present

## 2017-09-15 DIAGNOSIS — Z91018 Allergy to other foods: Secondary | ICD-10-CM | POA: Diagnosis not present

## 2017-09-15 DIAGNOSIS — O99613 Diseases of the digestive system complicating pregnancy, third trimester: Secondary | ICD-10-CM | POA: Diagnosis not present

## 2017-09-15 DIAGNOSIS — F17210 Nicotine dependence, cigarettes, uncomplicated: Secondary | ICD-10-CM | POA: Insufficient documentation

## 2017-09-15 DIAGNOSIS — O99333 Smoking (tobacco) complicating pregnancy, third trimester: Secondary | ICD-10-CM | POA: Insufficient documentation

## 2017-09-15 DIAGNOSIS — Z886 Allergy status to analgesic agent status: Secondary | ICD-10-CM | POA: Diagnosis not present

## 2017-09-15 DIAGNOSIS — Z888 Allergy status to other drugs, medicaments and biological substances status: Secondary | ICD-10-CM | POA: Diagnosis not present

## 2017-09-15 DIAGNOSIS — O99013 Anemia complicating pregnancy, third trimester: Secondary | ICD-10-CM | POA: Diagnosis not present

## 2017-09-15 DIAGNOSIS — M5431 Sciatica, right side: Secondary | ICD-10-CM | POA: Diagnosis not present

## 2017-09-15 DIAGNOSIS — Z91030 Bee allergy status: Secondary | ICD-10-CM | POA: Insufficient documentation

## 2017-09-15 DIAGNOSIS — K219 Gastro-esophageal reflux disease without esophagitis: Secondary | ICD-10-CM | POA: Diagnosis not present

## 2017-09-15 DIAGNOSIS — Z832 Family history of diseases of the blood and blood-forming organs and certain disorders involving the immune mechanism: Secondary | ICD-10-CM | POA: Diagnosis not present

## 2017-09-15 DIAGNOSIS — O34219 Maternal care for unspecified type scar from previous cesarean delivery: Secondary | ICD-10-CM | POA: Diagnosis not present

## 2017-09-15 DIAGNOSIS — Z79899 Other long term (current) drug therapy: Secondary | ICD-10-CM | POA: Diagnosis not present

## 2017-09-15 DIAGNOSIS — H539 Unspecified visual disturbance: Secondary | ICD-10-CM

## 2017-09-15 DIAGNOSIS — D649 Anemia, unspecified: Secondary | ICD-10-CM

## 2017-09-15 DIAGNOSIS — O368130 Decreased fetal movements, third trimester, not applicable or unspecified: Secondary | ICD-10-CM

## 2017-09-15 LAB — CBC
HCT: 22.1 % — ABNORMAL LOW (ref 36.0–46.0)
Hemoglobin: 7.1 g/dL — ABNORMAL LOW (ref 12.0–15.0)
MCH: 21.6 pg — AB (ref 26.0–34.0)
MCHC: 32.1 g/dL (ref 30.0–36.0)
MCV: 67.2 fL — ABNORMAL LOW (ref 78.0–100.0)
Platelets: 237 10*3/uL (ref 150–400)
RBC: 3.29 MIL/uL — AB (ref 3.87–5.11)
RDW: 16.4 % — AB (ref 11.5–15.5)
WBC: 8.2 10*3/uL (ref 4.0–10.5)

## 2017-09-15 LAB — URINALYSIS, ROUTINE W REFLEX MICROSCOPIC
BILIRUBIN URINE: NEGATIVE
GLUCOSE, UA: NEGATIVE mg/dL
HGB URINE DIPSTICK: NEGATIVE
KETONES UR: NEGATIVE mg/dL
NITRITE: POSITIVE — AB
PH: 7 (ref 5.0–8.0)
Protein, ur: NEGATIVE mg/dL
Specific Gravity, Urine: 1.017 (ref 1.005–1.030)

## 2017-09-15 LAB — COMPREHENSIVE METABOLIC PANEL
ALT: 12 U/L — ABNORMAL LOW (ref 14–54)
ANION GAP: 5 (ref 5–15)
AST: 20 U/L (ref 15–41)
Albumin: 2.7 g/dL — ABNORMAL LOW (ref 3.5–5.0)
Alkaline Phosphatase: 72 U/L (ref 38–126)
BUN: 9 mg/dL (ref 6–20)
CHLORIDE: 104 mmol/L (ref 101–111)
CO2: 24 mmol/L (ref 22–32)
Calcium: 8.9 mg/dL (ref 8.9–10.3)
Creatinine, Ser: 0.48 mg/dL (ref 0.44–1.00)
GFR calc non Af Amer: >60 mL/min (ref >60)
Glucose, Bld: 82 mg/dL (ref 65–99)
POTASSIUM: 3.4 mmol/L — AB (ref 3.5–5.1)
SODIUM: 133 mmol/L — AB (ref 135–145)
Total Bilirubin: 0.5 mg/dL (ref 0.3–1.2)
Total Protein: 6.8 g/dL (ref 6.5–8.1)

## 2017-09-15 LAB — PROTEIN / CREATININE RATIO, URINE
Creatinine, Urine: 164 mg/dL
PROTEIN CREATININE RATIO: 0.05 mg/mg{creat} (ref 0.00–0.15)
TOTAL PROTEIN, URINE: 9 mg/dL

## 2017-09-15 MED ORDER — SODIUM CHLORIDE 0.9 % IV SOLN
INTRAVENOUS | Status: DC
  Administered 2017-09-15: 18:00:00 via INTRAVENOUS

## 2017-09-15 MED ORDER — FERROUS SULFATE 325 (65 FE) MG PO TABS: 325.0000 mg | ORAL_TABLET | Freq: Two times a day (BID) | ORAL | 1 refills | Status: AC

## 2017-09-15 MED ORDER — FERROUS SULFATE 325 (65 FE) MG PO TABS: 325.0000 mg | ORAL_TABLET | Freq: Two times a day (BID) | ORAL | 1 refills | Status: DC

## 2017-09-15 MED ORDER — FERUMOXYTOL INJECTION 510 MG/17 ML
510.0000 mg | Freq: Once | INTRAVENOUS | Status: AC
  Administered 2017-09-15: 510 mg via INTRAVENOUS
  Filled 2017-09-15: qty 17

## 2017-09-15 MED ORDER — RANITIDINE HCL 150 MG PO TABS: 150.0000 mg | ORAL_TABLET | Freq: Every day | ORAL | 1 refills | Status: AC

## 2017-09-15 MED ORDER — ACETAMINOPHEN 325 MG PO TABS
650.0000 mg | ORAL_TABLET | Freq: Once | ORAL | Status: AC
  Administered 2017-09-15: 650 mg via ORAL
  Filled 2017-09-15: qty 2

## 2017-09-15 NOTE — MAU Provider Note (Signed)
Chief Complaint:  No chief complaint on file.   None    HPI: Amber Howard is a 21 y.o. [phone] at [redacted]w[redacted]d with di-di twins who presents to MAU reporting multiple complaints. Patient states that starting two days ago she started having vision spots that she feels is worsening, She has also noted some increased swelling in her legs and feet. She gets occasional headache (denies one currently). She denies RUQ pain. Has a history of preeclampsia in prior twin pregnancy. Patient also complains of sciatica pain and low abck pain. Has been having bad reflux that is not responsive to TUMS.   Of not patient has not had any prenatal care this pregnancy. She has missed 3 appointments for care. She states she is currently in a shelter and without transportation.  Has had 3 prior c-sections. Preterm twins.   Denies contractions, leakage of fluid, vaginal discharge, or vaginal bleeding. Decreased fetal movement.   Pregnancy Course:  No PNC Di-Di Twin pregnancy  Past Medical History: Past Medical History:  Diagnosis Date  . Anemia   . Fibroids   . Migraine   . Miscarriage     Past obstetric history: OB History  Gravida Para Term Preterm AB Living  6 3 2 1 2 4   SAB TAB Ectopic Multiple Live Births  2     1 4     # Outcome Date GA Lbr Len/2nd Weight Sex Delivery Anes PTL Lv  6 Current           5 Term 11/05/16 [redacted]w[redacted]d  2.895 kg (6 lb 6.1 oz) M CS-LTranv Spinal  LIV  4A Preterm 07/07/15   1.899 kg (4 lb 3 oz) M CS-LTranv  Y LIV     Complications: IUGR (intrauterine growth restriction)  4B Preterm    2.013 kg (4 lb 7 oz) F CS-LTranv   LIV     Complications: IUGR (intrauterine growth restriction),Breech presentation at birth  3 Term 01/25/12 [redacted]w[redacted]d  3.033 kg (6 lb 11 oz) M CS-LTranv   LIV     Birth Comments: C/s fetal distress, nucal , anemia  2 SAB           1 SAB               Past Surgical History: Past Surgical History:  Procedure Laterality Date  . CESAREAN SECTION  2013  .  CESAREAN SECTION N/A 11/05/2016   Procedure: CESAREAN SECTION;  Surgeon: Caren Macadam, MD;  Location: [facility];  Service: Obstetrics;  Laterality: N/A;  . NOSE SURGERY  2015     Family History: Family History  Problem Relation Age of Onset  . Anemia Mother   . Fibroids Mother     Social History: Social History  Substance Use Topics  . Smoking status: Current Some Day Smoker    Packs/day: 0.25    Types: Cigarettes    Last attempt to quit: 10/20/2014  . Smokeless tobacco: Never Used  . Alcohol use No    Allergies:  Allergies  Allergen Reactions  . 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 Bee Venom Anaphylaxis  . Onion Anaphylaxis  . Other Anaphylaxis, Hives, Itching and Other (See Comments)    Pt states that she is allergic to fire ants.    . Wasp Venom Protein Anaphylaxis  . Benadryl [Diphenhydramine] Hives  . [name] Isothiocyanate] Hives  . Orange Fruit [Citrus] Hives    Meds:  Prescriptions Prior to Admission  Medication Sig Dispense Refill Last Dose  .  ferrous sulfate (FERROUSUL) 325 (65 FE) MG tablet Take 1 tablet (325 mg total) by mouth 2 (two) times daily. 60 tablet 1   . prenatal vitamin w/FE, FA (PRENATAL 1 + 1) 27-1 MG TABS tablet Take 1 tablet by mouth daily. 33 each 0     I have reviewed patient's Past Medical Hx, Surgical Hx, Family Hx, Social Hx, medications and allergies.   ROS:  All systems reviewed and are negative for acute change except as noted in the HPI.   Physical Exam   Patient Vitals for the past 24 hrs:  BP Temp Temp src Pulse Resp SpO2  09/15/17 1700 121/63 98.7 F (37.1 C) Oral 99 20 100 %   Constitutional: Well-developed, well-nourished female in no acute distress. Obese  Cardiovascular: normal rate and rhythm Respiratory: normal effort, CTA B GI: Abd soft, non-tender, gravid appropriate for gestational age. MS: Extremities nontender, trace edema, normal ROM Neurologic: Alert and oriented x 4. No focal  deficits  Labs: Results for orders placed or performed during the hospital encounter of 09/15/17 (from the past 24 hour(s))  Urinalysis, Routine w reflex microscopic     Status: Abnormal   Collection Time: 09/15/17  4:35 PM  Result Value Ref Range   Color, Urine YELLOW YELLOW   APPearance CLEAR CLEAR   Specific Gravity, Urine 1.017 1.005 - 1.030   pH 7.0 5.0 - 8.0   Glucose, UA NEGATIVE NEGATIVE mg/dL   Hgb urine dipstick NEGATIVE NEGATIVE   Bilirubin Urine NEGATIVE NEGATIVE   Ketones, ur NEGATIVE NEGATIVE mg/dL   Protein, ur NEGATIVE NEGATIVE mg/dL   Nitrite POSITIVE (A) NEGATIVE   Leukocytes, UA TRACE (A) NEGATIVE   RBC / HPF 0-5 0 - 5 RBC/hpf   WBC, UA 6-30 0 - 5 WBC/hpf   Bacteria, UA MANY (A) NONE SEEN   Squamous Epithelial / LPF 6-30 (A) NONE SEEN   Mucus PRESENT   Protein / creatinine ratio, urine     Status: None   Collection Time: 09/15/17  4:35 PM  Result Value Ref Range   Creatinine, Urine 164.00 mg/dL   Total Protein, Urine 9 mg/dL   Protein Creatinine Ratio 0.05 0.00 - 0.15 mg/mg[Cre]  CBC     Status: Abnormal   Collection Time: 09/15/17  5:12 PM  Result Value Ref Range   WBC 8.2 4.0 - 10.5 K/uL   RBC 3.29 (L) 3.87 - 5.11 MIL/uL   Hemoglobin 7.1 (L) 12.0 - 15.0 g/dL   HCT 22.1 (L) 36.0 - 46.0 %   MCV 67.2 (L) 78.0 - 100.0 fL   MCH 21.6 (L) 26.0 - 34.0 pg   MCHC 32.1 30.0 - 36.0 g/dL   RDW 16.4 (H) 11.5 - 15.5 %   Platelets 237 150 - 400 K/uL  Comprehensive metabolic panel     Status: Abnormal   Collection Time: 09/15/17  5:12 PM  Result Value Ref Range   Sodium 133 (L) 135 - 145 mmol/L   Potassium 3.4 (L) 3.5 - 5.1 mmol/L   Chloride 104 101 - 111 mmol/L   CO2 24 22 - 32 mmol/L   Glucose, Bld 82 65 - 99 mg/dL   BUN 9 6 - 20 mg/dL   Creatinine, Ser 0.48 0.44 - 1.00 mg/dL   Calcium 8.9 8.9 - 10.3 mg/dL   Total Protein 6.8 6.5 - 8.1 g/dL   Albumin 2.7 (L) 3.5 - 5.0 g/dL   AST 20 15 - 41 U/L   ALT 12 (L) 14 - 54  U/L   Alkaline Phosphatase 72 38 - 126  U/L   Total Bilirubin 0.5 0.3 - 1.2 mg/dL   GFR calc non Af Amer >60 >60 mL/min   GFR calc Af Amer >60 >60 mL/min   Anion gap 5 5 - 15    Imaging:  No results found.  MAU Course: Vitals and nursing notes reviewed I have ordered labsand reviewed them Anemia with Hbg 7.1 > Iron infusion ordered Pre-E labs normal BP normal Treatments given in MAU: Tylenol for pain Discussed with Dr. Si Raider  I personally reviewed the patient's NST today, found to be REACTIVE. Twin A 145 twin B 140 bpm, mod var, +accels, no decels. CTX: None.   MDM: Plan of care reviewed with patient, including labs and tests ordered and medical treatment.   Assessment: 1. Anemia during pregnancy in third trimester   2. Vision changes   3. Sciatica of right side   4. Gastroesophageal reflux disease, esophagitis presence not specified     Plan: Discharge home in stable condition Rx for Zantac given  Rx for iron which I encouraged patient to take with bowel regimen Preterm labor precautions and fetal kick counts given Handout given Follow-up with OB provider on Friday for initial prenatal visit   Luiz Blare, [place] for [facility], [facility] 09/15/2017 5:07 PM

## 2017-09-15 NOTE — MAU Note (Signed)
Pt presents with c/o visual disturbances that began 4 days ago, lower back pain that 3 days ago, lower abdominal pain, H/A, and swelling that started 2 days ago, and decreased FM and vomiting since yesterday.  Denies VB.

## 2017-09-15 NOTE — Discharge Instructions (Signed)
Anemia, Nonspecific Anemia is a condition in which the concentration of red blood cells or hemoglobin in the blood is below normal. Hemoglobin is a substance in red blood cells that carries oxygen to the tissues of the body. Anemia results in not enough oxygen reaching these tissues. What are the causes? Common causes of anemia include:  Excessive bleeding. Bleeding may be internal or external. This includes excessive bleeding from periods (in women) or from the intestine.  Poor nutrition.  Chronic kidney, thyroid, and liver disease.  Bone marrow disorders that decrease red blood cell production.  Cancer and treatments for cancer.  HIV, AIDS, and their treatments.  Spleen problems that increase red blood cell destruction.  Blood disorders.  Excess destruction of red blood cells due to infection, medicines, and autoimmune disorders. What are the signs or symptoms?  Minor weakness.  Dizziness.  Headache.  Palpitations.  Shortness of breath, especially with exercise.  Paleness.  Cold sensitivity.  Indigestion.  Nausea.  Difficulty sleeping.  Difficulty concentrating. Symptoms may occur suddenly or they may develop slowly. How is this diagnosed? Additional blood tests are often needed. These help your health care provider determine the best treatment. Your health care provider will check your stool for blood and look for other causes of blood loss. How is this treated? Treatment varies depending on the cause of the anemia. Treatment can include:  Supplements of iron, vitamin B12, or folic acid.  Hormone medicines.  A blood transfusion. This may be needed if blood loss is severe.  Hospitalization. This may be needed if there is significant continual blood loss.  Dietary changes.  Spleen removal. Follow these instructions at home: Keep all follow-up appointments. It often takes many weeks to correct anemia, and having your health care provider check on your  condition and your response to treatment is very important. Get help right away if:  You develop extreme weakness, shortness of breath, or chest pain.  You become dizzy or have trouble concentrating.  You develop heavy vaginal bleeding.  You develop a rash.  You have bloody or black, tarry stools.  You faint.  You vomit up blood.  You vomit repeatedly.  You have abdominal pain.  You have a fever or persistent symptoms for more than 2-3 days.  You have a fever and your symptoms suddenly get worse.  You are dehydrated. This information is not intended to replace advice given to you by your health care provider. Make sure you discuss any questions you have with your health care provider. Document Released: 01/22/2005 Document Revised: 05/28/2016 Document Reviewed: 06/10/2013 Elsevier Interactive Patient Education  2017 [employer]  

## 2017-09-16 LAB — POCT PREGNANCY, URINE: PREG TEST UR: NEGATIVE

## 2017-09-18 ENCOUNTER — Encounter: Payer: Self-pay | Admitting: Family Medicine

## 2018-02-20 ENCOUNTER — Encounter (HOSPITAL_COMMUNITY): Payer: Self-pay

## 2018-02-24 IMAGING — US US MFM FETAL BPP W/O NON-STRESS
1 series · 13 of 13 positions shown · non-contrast
Comparison: none

[Series 1: us mfm fetal bpp w/o non-stress · 13 acquisitions, 13 frames shown]
[im 1/13]
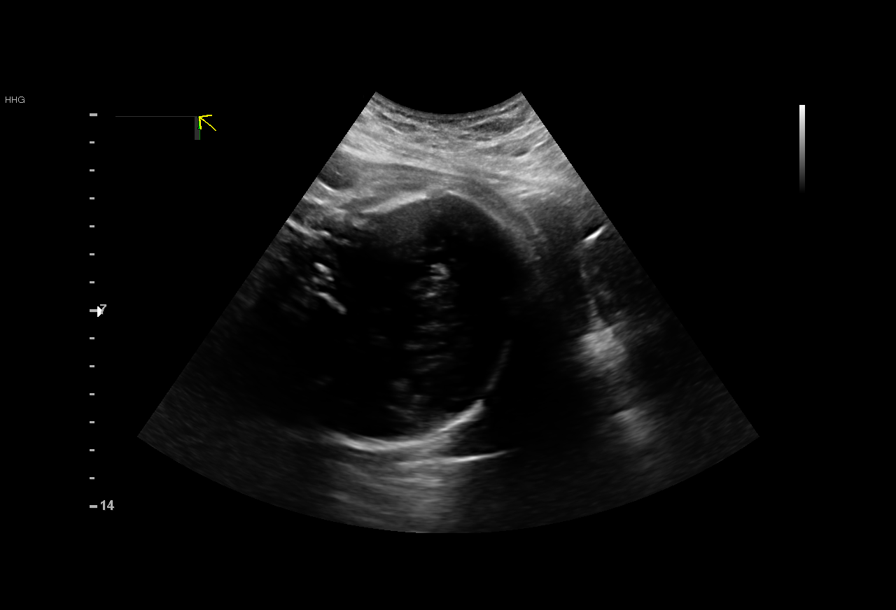
[im 2/13]
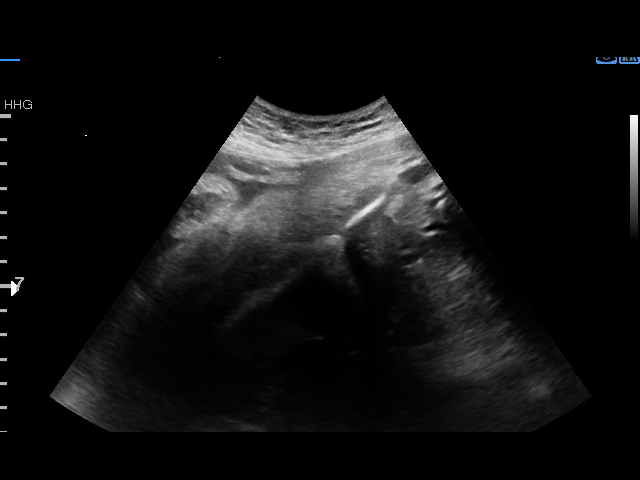
[im 3/13]
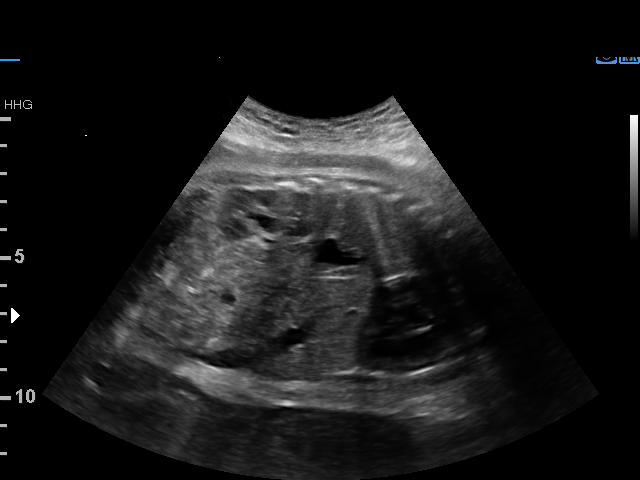
[im 4/13]
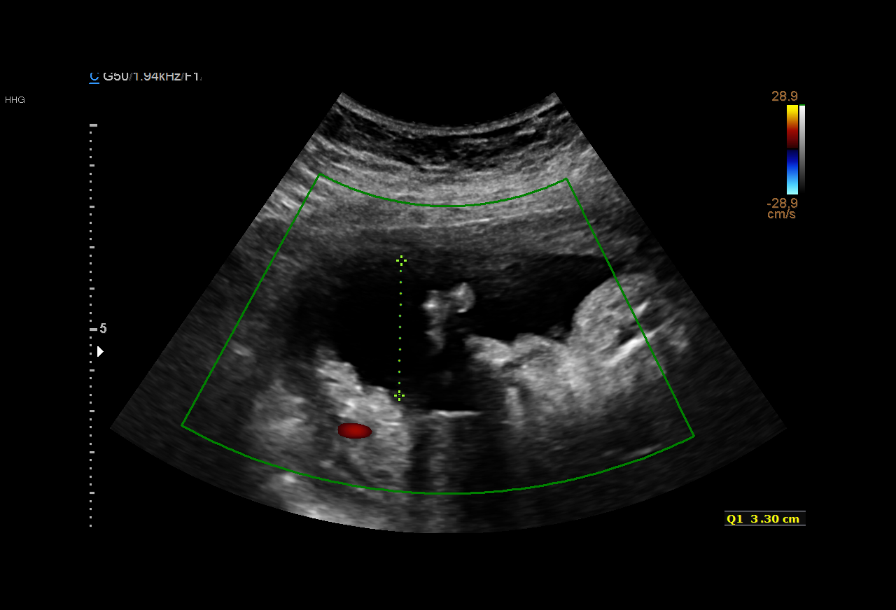
[im 5/13]
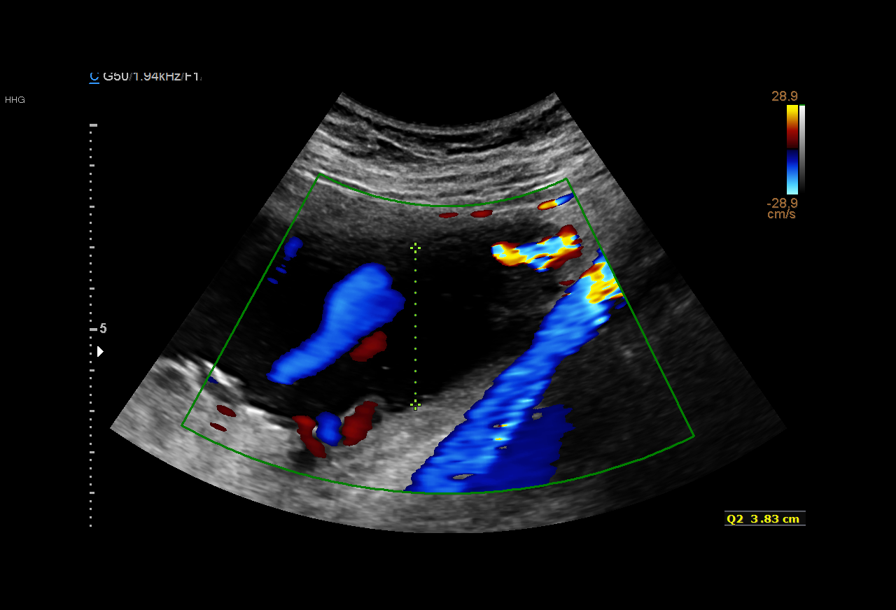
[im 6/13]
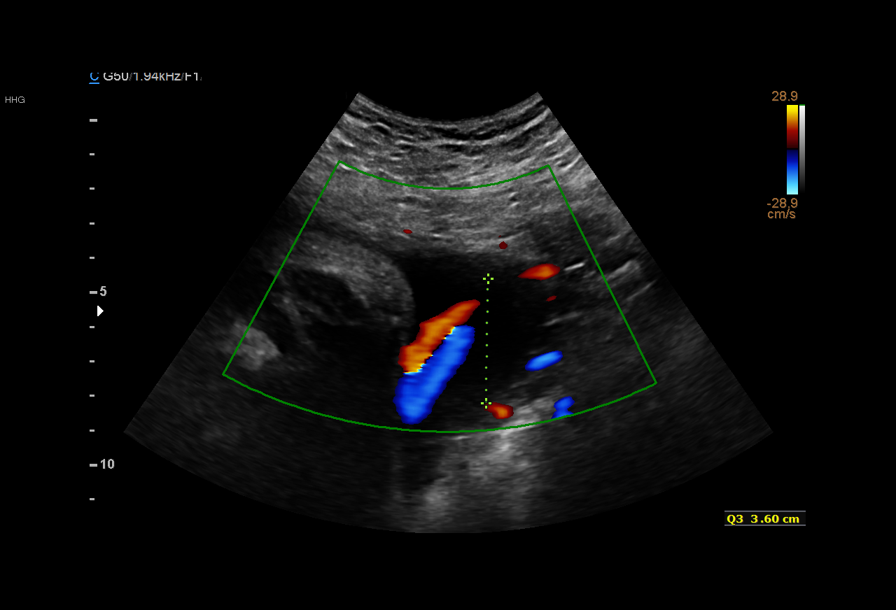
[im 7/13]
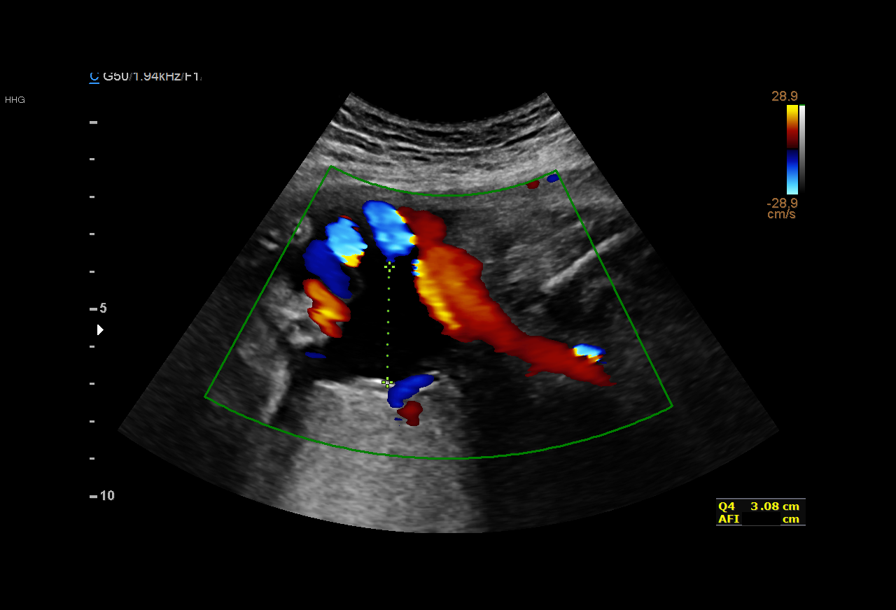
[im 8/13]
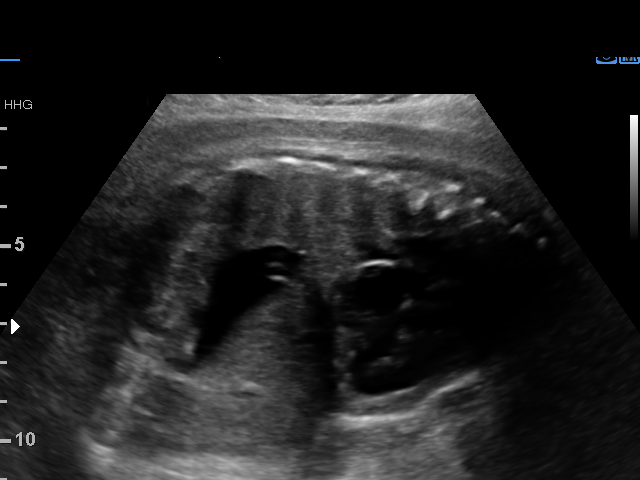
[im 9/13]
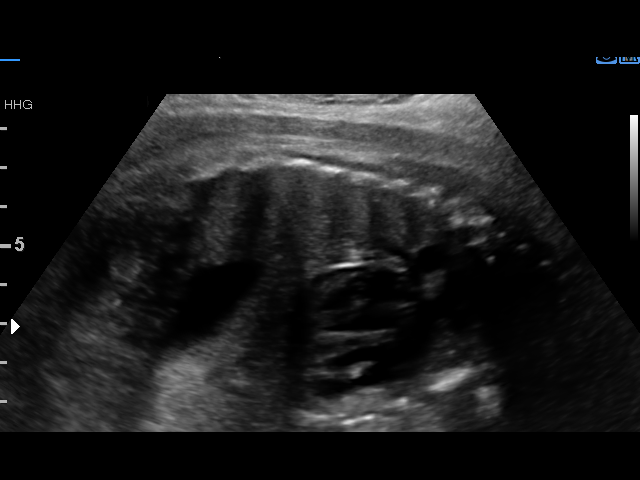
[im 10/13]
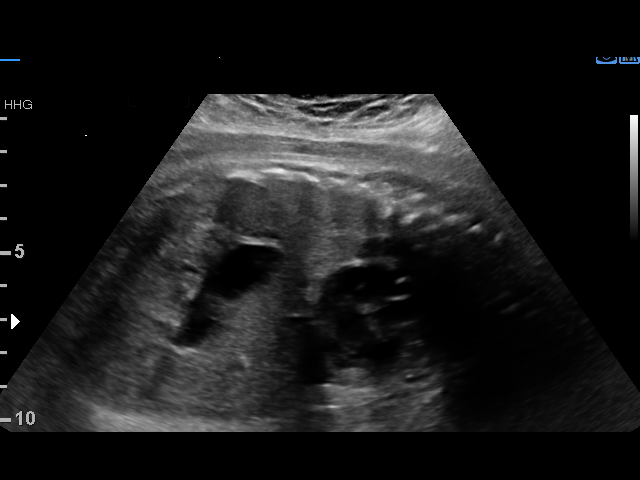
[im 11/13]
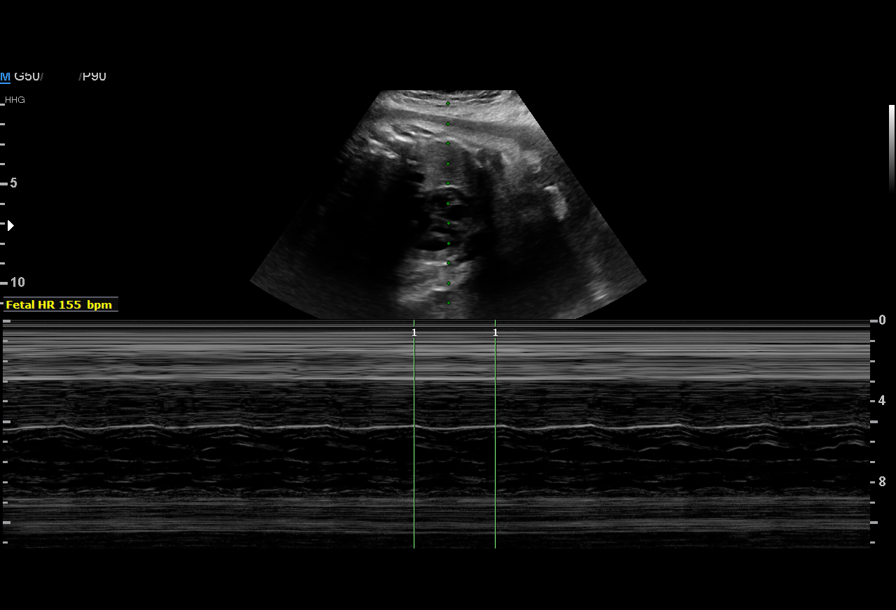
[im 12/13]
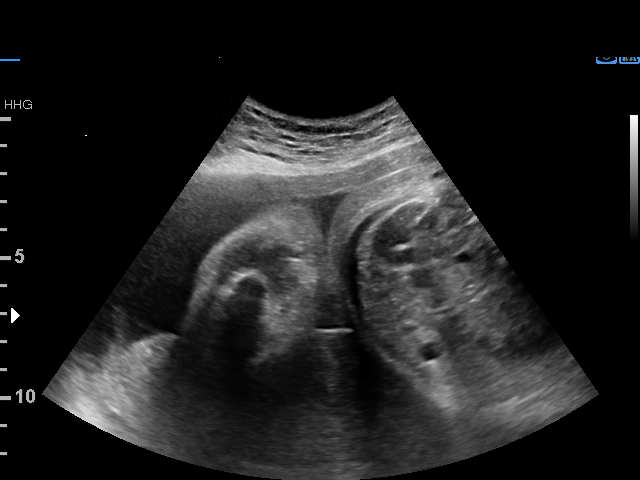
[im 13/13]
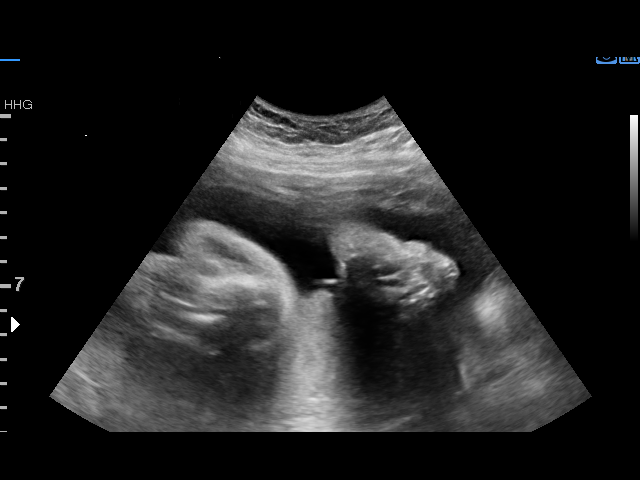

[13 of 13 positions shown; findings below may reference images not displayed]

ANIMESH

1  CHEE CHONG ZIKIR          [id number]      [id number]     [id number]
Indications

35 weeks gestation of pregnancy
Preterm contractions
History of cesarean delivery, currently
pregnant
Poor obstetric history: Previous preterm
delivery ,twins (31wks) antepartum
OB History

Gravidity:    5         Term:   1        Prem:   2        SAB:   2
Living:       3
Fetal Evaluation

Num Of Fetuses:     1
Fetal Heart         155
Rate(bpm):
Cardiac Activity:   Observed
Presentation:       Cephalic
Placenta:           Posterior

Amniotic Fluid
AFI FV:      Subjectively within normal limits

AFI Sum(cm)     %Tile       Largest Pocket(cm)
13.81           49

RUQ(cm)       RLQ(cm)       LUQ(cm)        LLQ(cm)
3.3
Biophysical Evaluation

Amniotic F.V:   Within normal limits       F. Tone:        Observed
F. Movement:    Observed                   Score:          [DATE]
F. Breathing:   Observed
Gestational Age

LMP:           36w 0d       Date:   01/30/16                 EDD:   11/05/16
Clinical EDD:  35w 0d                                        EDD:   11/12/16
Best:          35w 0d    Det. By:   Early Ultrasound         EDD:   11/12/16
(03/19/16)
Impression

Single IUP at 35w 0d
BPP [DATE]
Cephalic presentation
Normal amniotic fluid volume
Recommendations

Follow-up ultrasounds as clinically indicated.

## 2019-01-02 IMAGING — US US MFM OB TRANSVAGINAL
1 series · 15 of 18 positions shown · non-contrast
Comparison: none

[Series 1: us mfm ob transvaginal · 18 acquisitions, 15 frames shown]
[im 1/18]
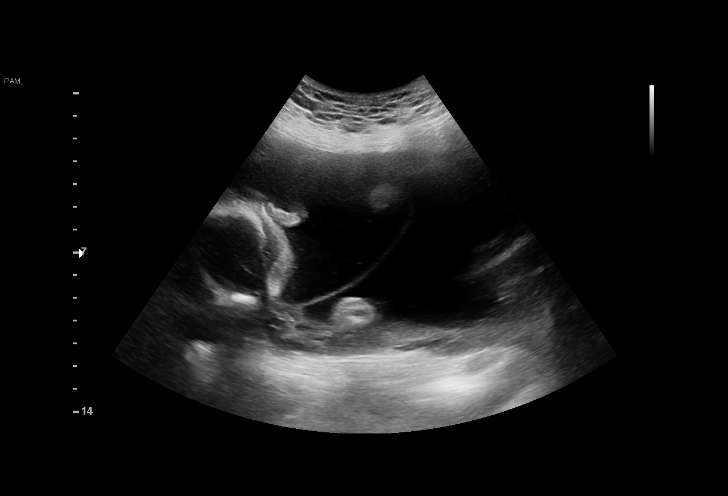
[im 2/18]
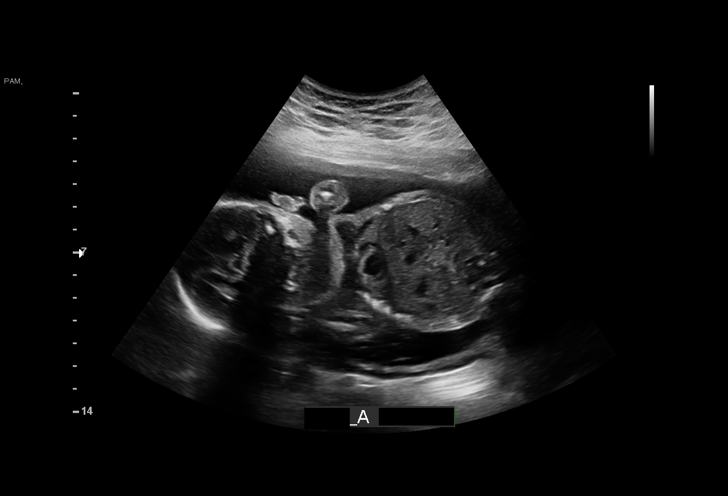
[im 4/18]
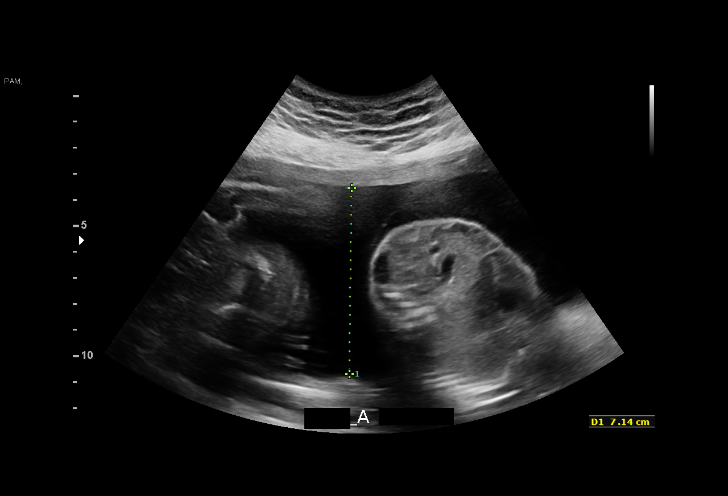
[im 5/18]
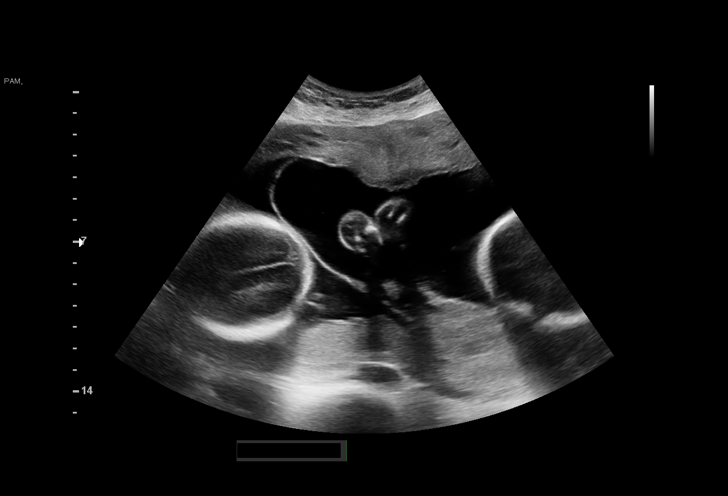
[im 6/18]
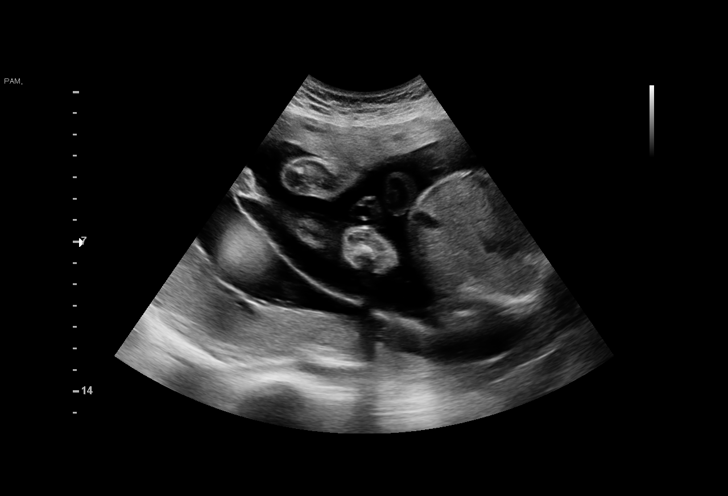
[im 7/18]
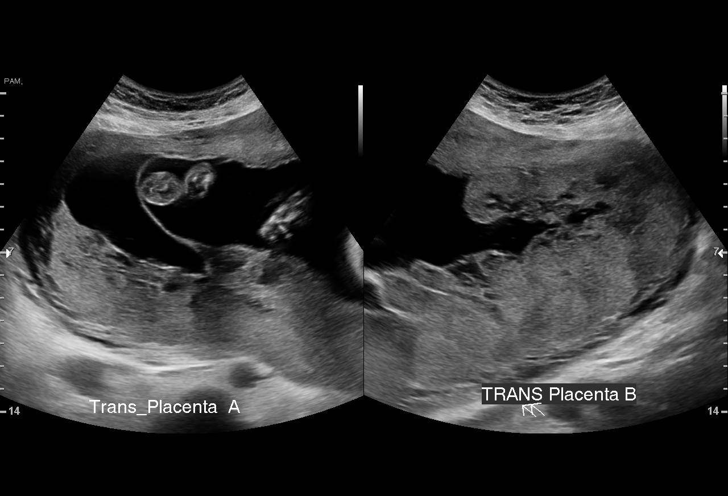
[im 8/18]
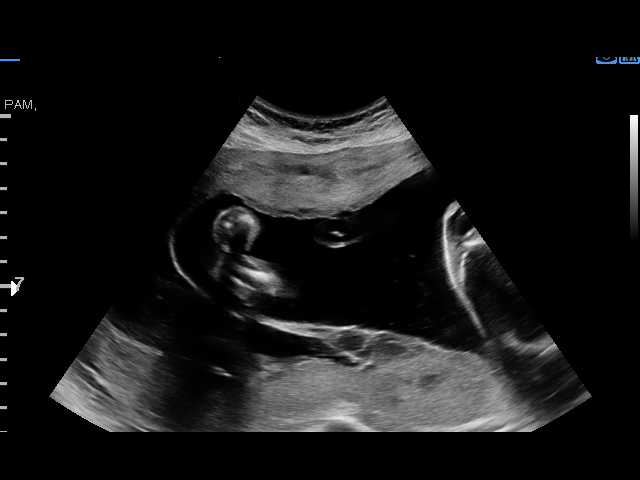
[im 10/18]
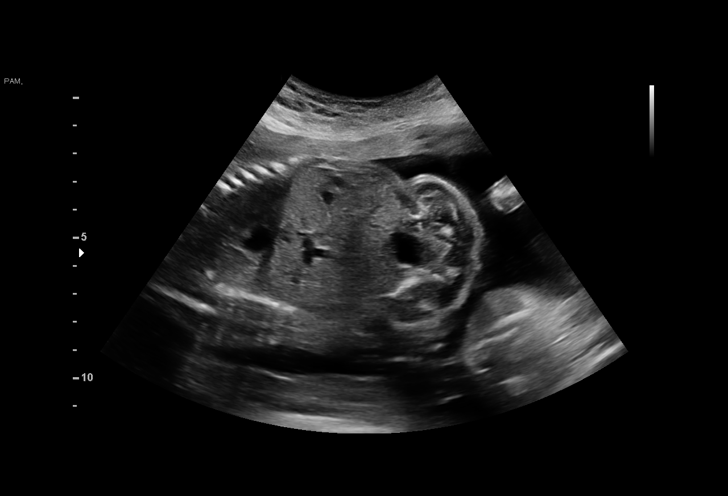
[im 11/18]
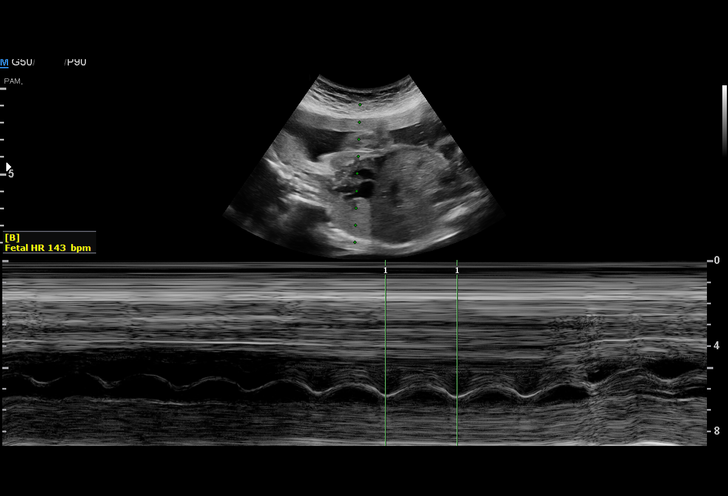
[im 12/18]
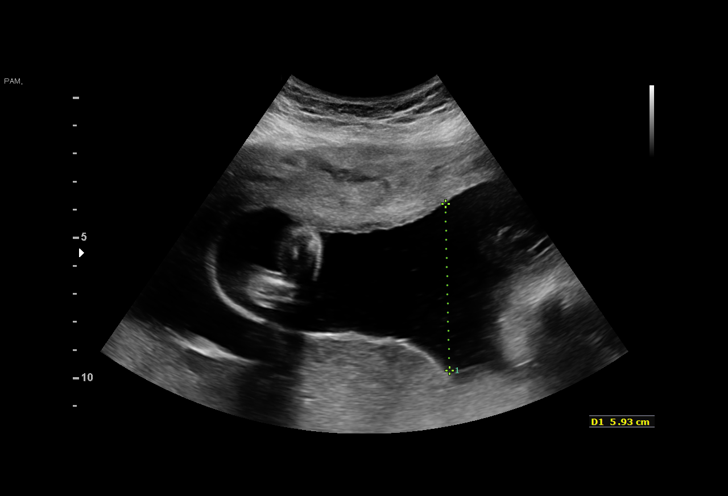
[im 13/18]
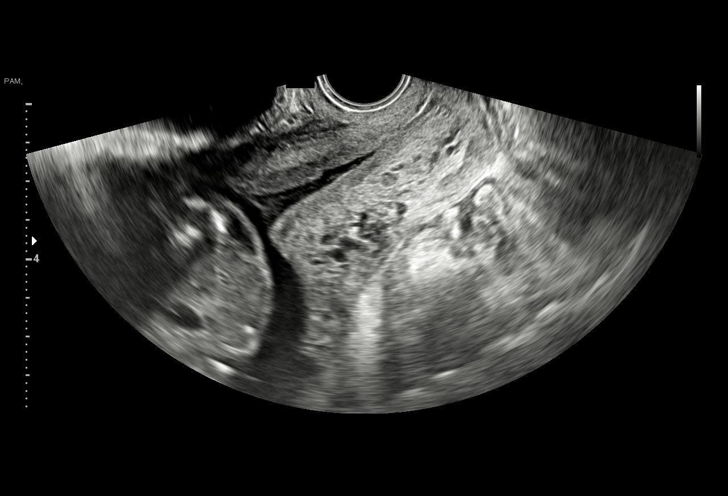
[im 14/18]
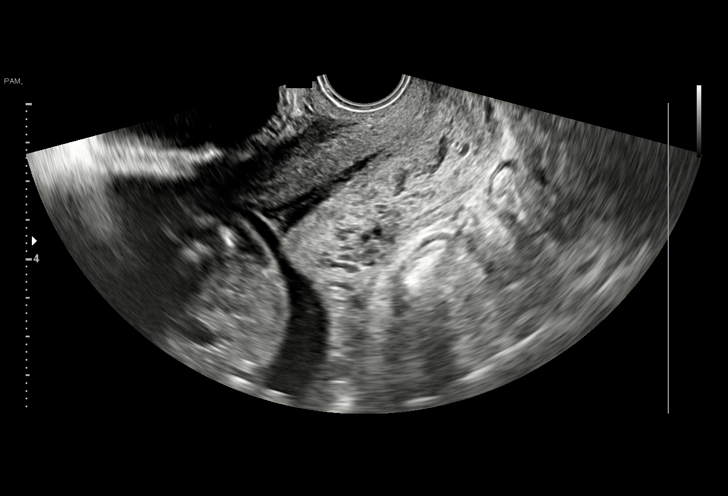
[im 16/18]
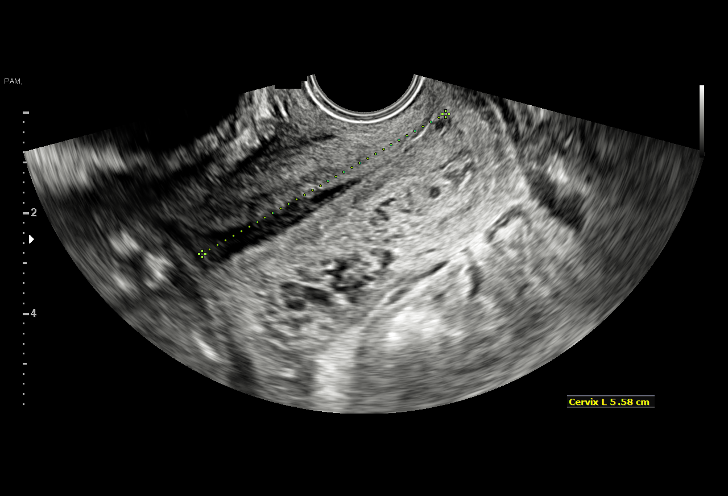
[im 17/18]
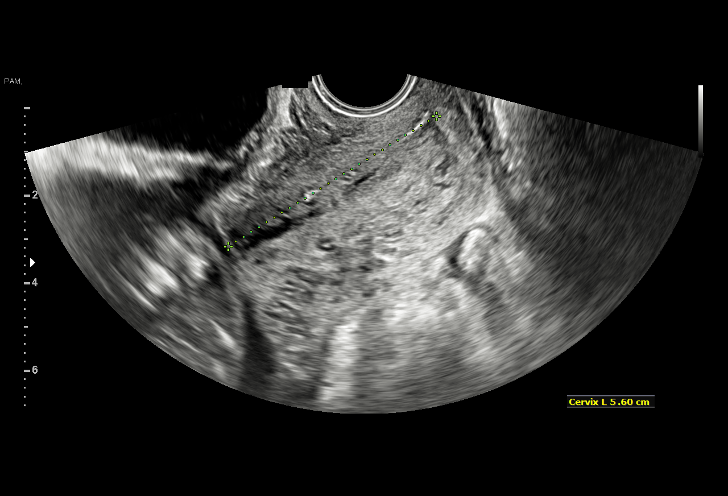
[im 18/18]
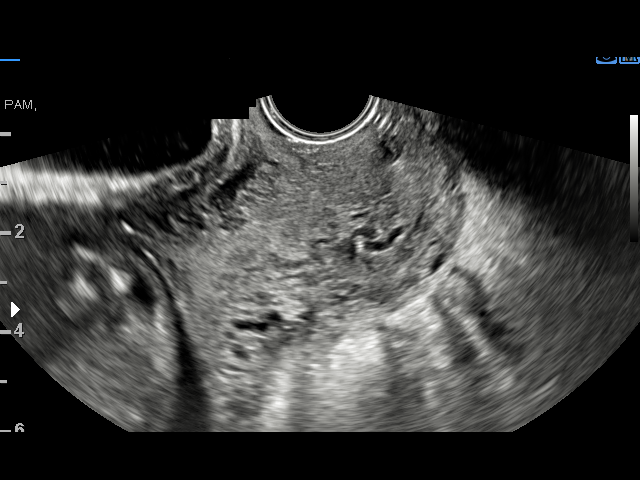

[15 of 18 positions shown; findings below may reference images not displayed]

[facility]

1  HIDENORI SIRBU               [id number]      [id number]     [id number]
2  HIDENORI SIRBU               [id number]      [id number]     [id number]
Indications

25 weeks gestation of pregnancy
Twin pregnancy, di/di, second trimester
Abdominal pain in pregnancy
OB History

Gravidity:     6         Term:  2          Prem:  2        SAB:   2
Living:        4
Fetal Evaluation (Fetus A)

Num Of Fetuses:      2
Fetal Heart          138
Rate(bpm):
Cardiac Activity:    Observed
Fetal Lie:           Right Fetus
Presentation:        Breech
Placenta:            Posterior, above cervical os
Membrane Desc:       Dividing Membrane seen - Dichorionic.

Amniotic Fluid
AFI FV:      Subjectively within normal limits

Largest Pocket(cm)
7.1
Gestational Age (Fetus A)

Clinical EDD:   25w 1d                                         EDD:  11/28/17
Best:           25w 1d    Det. By:  Clinical EDD               EDD:  11/28/17
Fetal Evaluation (Fetus B)

Num Of Fetuses:      2
Fetal Heart          143
Rate(bpm):
Cardiac Activity:    Observed
Fetal Lie:           Left Fetus
Presentation:        Breech
Placenta:            Left lateral, above cervical os
Membrane Desc:       Dividing Membrane seen - Dichorionic.

Amniotic Fluid
AFI FV:      Subjectively within normal limits

Largest Pocket(cm)
5.9
Gestational Age (Fetus B)

Clinical EDD:   25w 1d                                         EDD:  11/28/17
Best:           25w 1d    Det. By:  Clinical EDD               EDD:  11/28/17
Cervix Uterus Adnexa

Cervix
Length:             5.4  cm.
Normal appearance by transvaginal scan
Comments

The patient's notes state this is a monochorionic diamnotic twin
gestation. Ultrasound today is most consistent with a dichorionic
diamniotic twin gestation
Impression

Di-di twin pregnancy at 03w9d.
Breech x 2.
Normal amniotic fluid volume x 2.
Recommendations

Recommend serial ultrasounds for growth.

## 2019-01-17 IMAGING — US US OB LIMITED
1 series · 13 of 28 positions shown · non-contrast
Comparison: none

CLINICAL DATA: Pregnant patient in second trimester pregnancy with
abdominal pain.

EXAM:
LIMITED OBSTETRIC ULTRASOUND

[Series 1: us ob limited · 0.23mm/px · 13 of 47 slices shown]
[im 2/47]
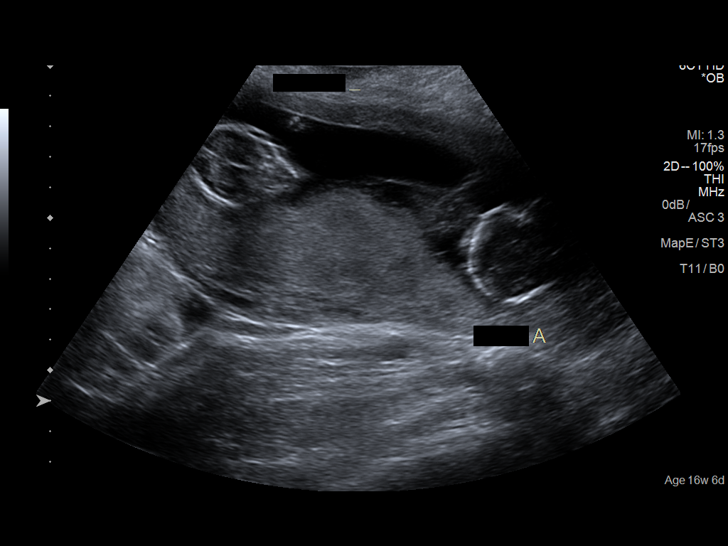
[im 6/47]
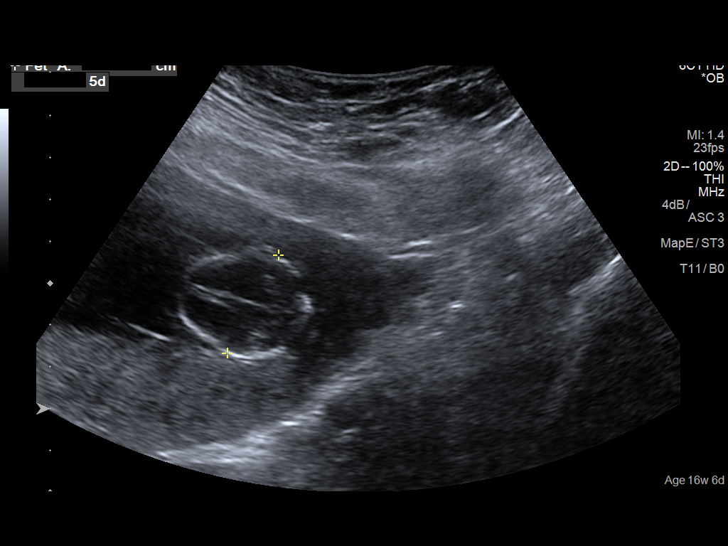
[im 9/47]
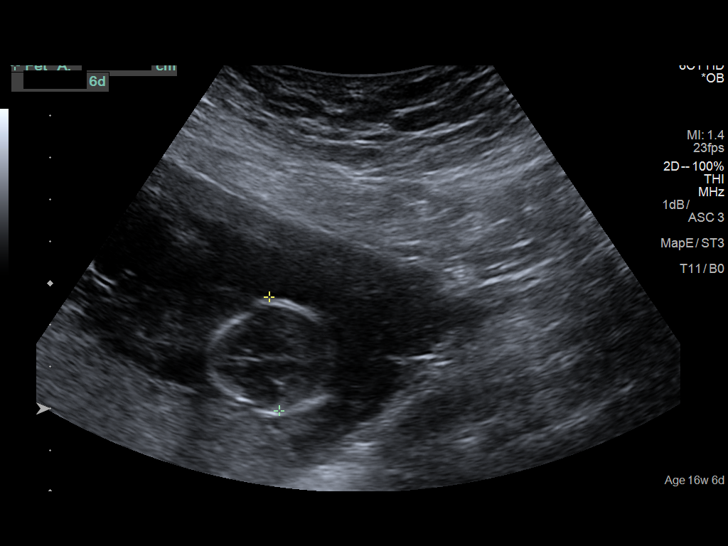
[im 12/47]
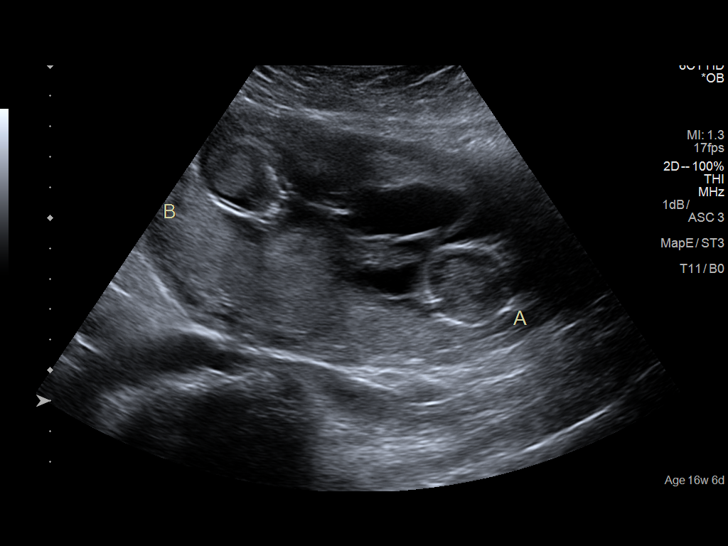
[im 16/47]
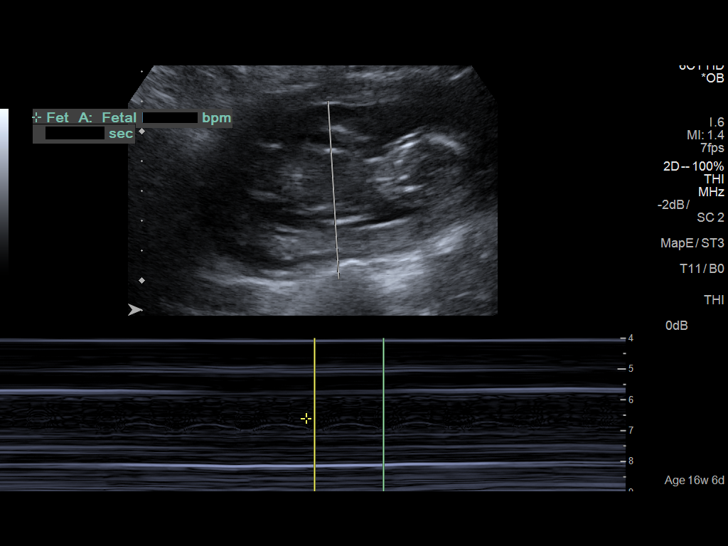
[im 19/47]
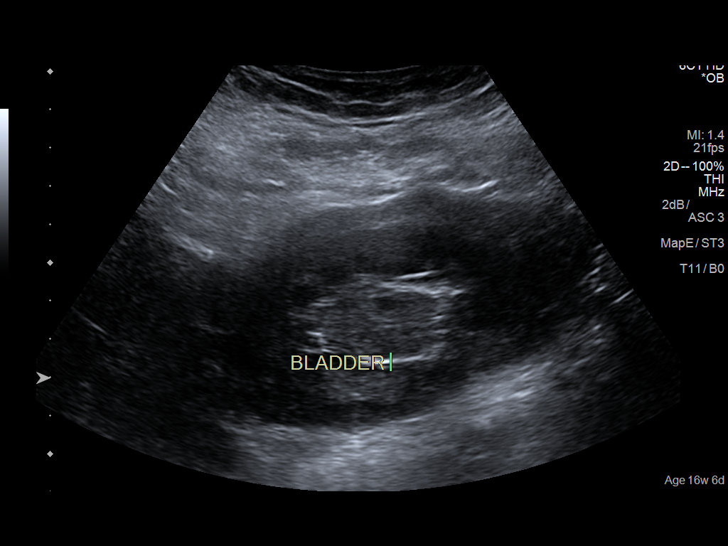
[im 24/47]
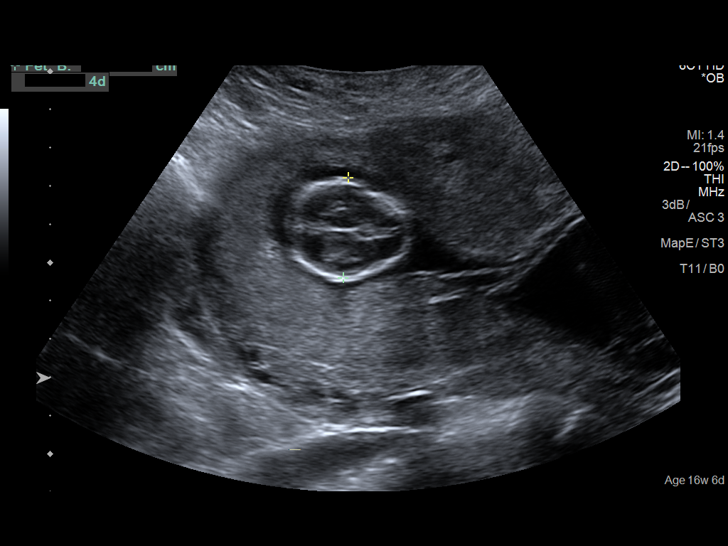
[im 28/47]
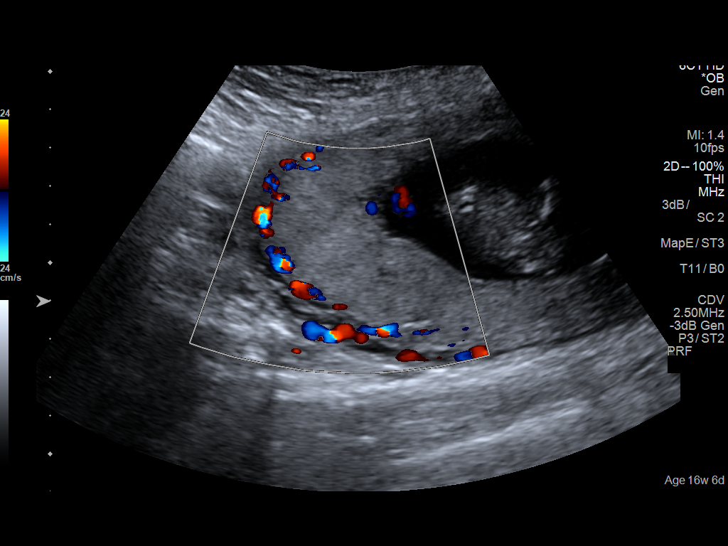
[im 31/47]
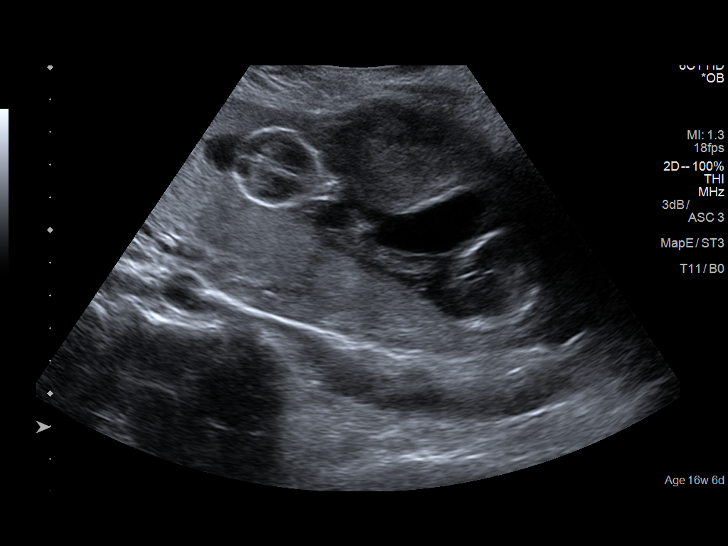
[im 35/47]
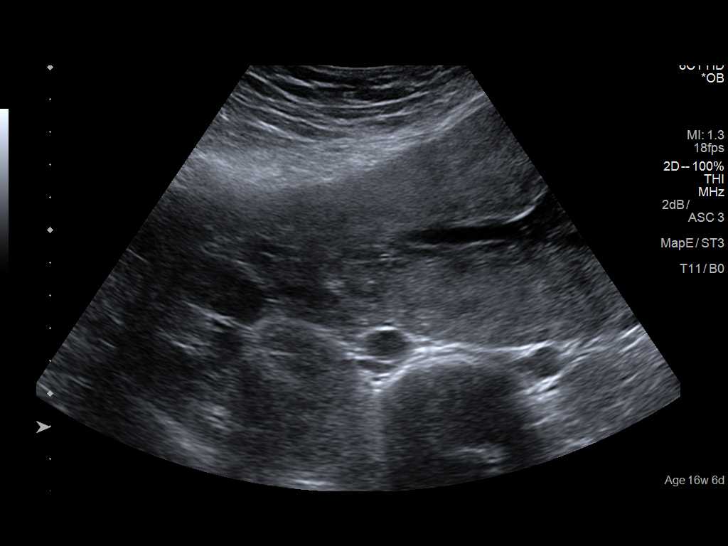
[im 38/47]
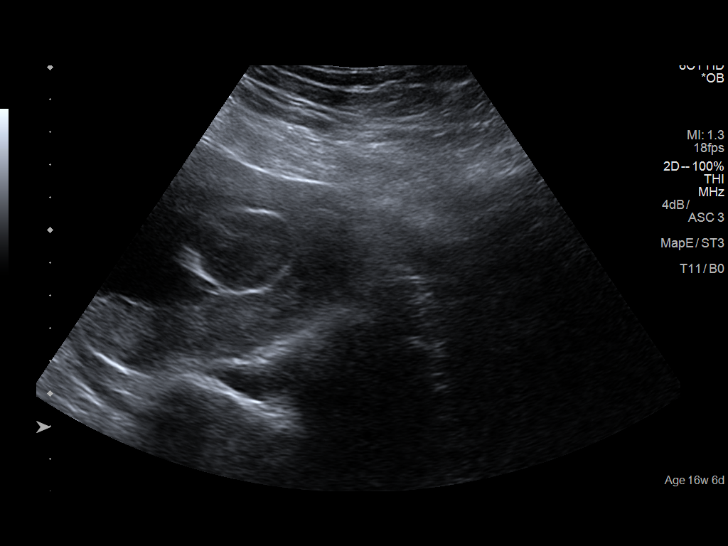
[im 41/47]
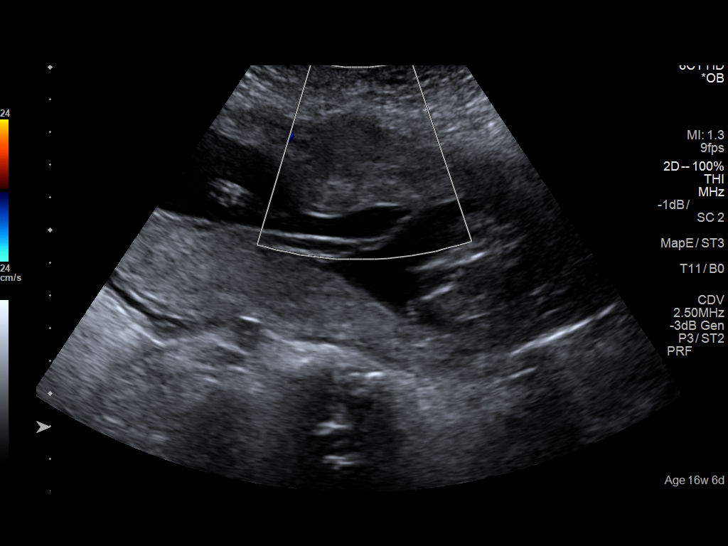
[im 45/47]
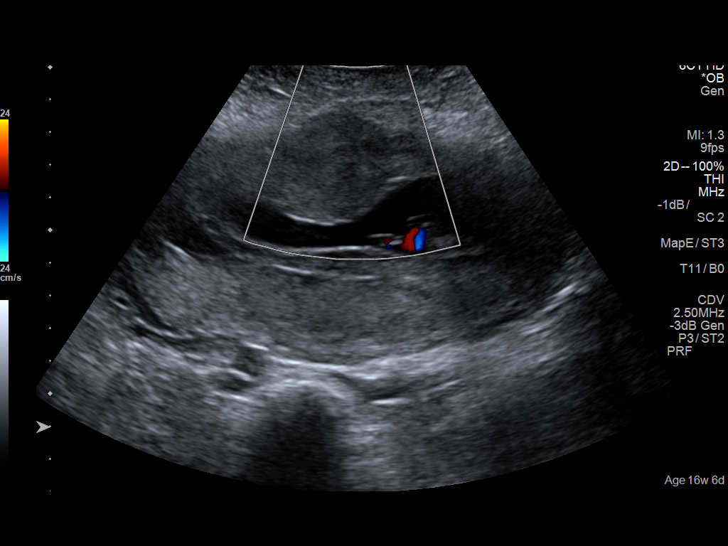

[13 of 28 positions shown; findings below may reference images not displayed]

FINDINGS: Number of Fetuses:  2

Separating Membrane: Visualized

TWIN 1

Heart Rate:  149 bpm

Movement: Yes

Presentation: Transverse head maternal left.

Placental Location: Posterior right lateral.

Previa: No

Amniotic Fluid (Subjective):  Within normal limits.

BPD:  2.68cm 14w  5d

TWIN 2

Heart Rate:  144 bpm

Movement: Yes

Presentation: Transverse head maternal right.

Placental Location: Posterior right lateral.

Previa: No.

Amniotic Fluid (Subjective): Within normal limits.

BPD:  2.61cm 14w  4d

MATERNAL FINDINGS:

Cervix:  Appears closed.

Uterus/Adnexae: Probable anterior fibroid measuring 2.5 x 2.3 x
cm.
IMPRESSION: Live intrauterine twin pregnancy estimated gestational age 14 weeks
5 days and 14 weeks 4 day respectively. This appears to be a
monochorionic diamniotic twin pregnancy with 2 gestational sacs and
probable single placenta.

This exam is performed on an emergent basis and does not
comprehensively evaluate fetal size, dating, or anatomy; follow-up
complete OB US should be considered if further fetal assessment is
warranted
